# Patient Record
Sex: Female | Born: 1935 | Race: Black or African American | Hispanic: No | Smoking: Former smoker
Health system: Southern US, Community
[De-identification: ages and names within clinical notes are randomized; demographics above are authoritative.]

## PROBLEM LIST (undated history)

## (undated) DIAGNOSIS — M542 Cervicalgia: Secondary | ICD-10-CM

## (undated) DIAGNOSIS — R0602 Shortness of breath: Secondary | ICD-10-CM

## (undated) DIAGNOSIS — I10 Essential (primary) hypertension: Secondary | ICD-10-CM

## (undated) DIAGNOSIS — G8929 Other chronic pain: Secondary | ICD-10-CM

## (undated) DIAGNOSIS — I255 Ischemic cardiomyopathy: Secondary | ICD-10-CM

## (undated) DIAGNOSIS — I2510 Atherosclerotic heart disease of native coronary artery without angina pectoris: Secondary | ICD-10-CM

## (undated) DIAGNOSIS — M47812 Spondylosis without myelopathy or radiculopathy, cervical region: Secondary | ICD-10-CM

## (undated) DIAGNOSIS — E785 Hyperlipidemia, unspecified: Secondary | ICD-10-CM

## (undated) DIAGNOSIS — Z789 Other specified health status: Secondary | ICD-10-CM

## (undated) DIAGNOSIS — G459 Transient cerebral ischemic attack, unspecified: Secondary | ICD-10-CM

## (undated) DIAGNOSIS — I209 Angina pectoris, unspecified: Secondary | ICD-10-CM

## (undated) DIAGNOSIS — I219 Acute myocardial infarction, unspecified: Secondary | ICD-10-CM

## (undated) DIAGNOSIS — E039 Hypothyroidism, unspecified: Secondary | ICD-10-CM

## (undated) DIAGNOSIS — I495 Sick sinus syndrome: Secondary | ICD-10-CM

## (undated) DIAGNOSIS — J189 Pneumonia, unspecified organism: Secondary | ICD-10-CM

## (undated) DIAGNOSIS — I509 Heart failure, unspecified: Secondary | ICD-10-CM

## (undated) DIAGNOSIS — Z950 Presence of cardiac pacemaker: Secondary | ICD-10-CM

## (undated) HISTORY — PX: ABDOMINAL HYSTERECTOMY: SHX81

## (undated) HISTORY — DX: Hypothyroidism, unspecified: E03.9

## (undated) HISTORY — DX: Cervicalgia: M54.2

## (undated) HISTORY — DX: Hyperlipidemia, unspecified: E78.5

## (undated) HISTORY — PX: TONSILLECTOMY: SUR1361

## (undated) HISTORY — DX: Pneumonia, unspecified organism: J18.9

## (undated) HISTORY — DX: Spondylosis without myelopathy or radiculopathy, cervical region: M47.812

## (undated) HISTORY — DX: Hypercalcemia: E83.52

## (undated) HISTORY — PX: OTHER SURGICAL HISTORY: SHX169

## (undated) HISTORY — DX: Sick sinus syndrome: I49.5

## (undated) HISTORY — DX: Transient cerebral ischemic attack, unspecified: G45.9

## (undated) HISTORY — DX: Essential (primary) hypertension: I10

## (undated) HISTORY — DX: Other chronic pain: G89.29

---

## 1998-01-21 DIAGNOSIS — G459 Transient cerebral ischemic attack, unspecified: Secondary | ICD-10-CM

## 1998-01-21 HISTORY — DX: Transient cerebral ischemic attack, unspecified: G45.9

## 2000-01-22 DIAGNOSIS — M47812 Spondylosis without myelopathy or radiculopathy, cervical region: Secondary | ICD-10-CM

## 2000-01-22 DIAGNOSIS — G8929 Other chronic pain: Secondary | ICD-10-CM

## 2000-01-22 HISTORY — DX: Other chronic pain: G89.29

## 2000-01-22 HISTORY — DX: Spondylosis without myelopathy or radiculopathy, cervical region: M47.812

## 2001-01-21 DIAGNOSIS — I495 Sick sinus syndrome: Secondary | ICD-10-CM

## 2001-01-21 HISTORY — DX: Sick sinus syndrome: I49.5

## 2003-01-22 HISTORY — PX: INSERT / REPLACE / REMOVE PACEMAKER: SUR710

## 2003-12-16 ENCOUNTER — Ambulatory Visit: Payer: Self-pay | Admitting: Internal Medicine

## 2004-01-25 ENCOUNTER — Ambulatory Visit: Payer: Self-pay | Admitting: Family Medicine

## 2004-02-14 ENCOUNTER — Ambulatory Visit: Payer: Self-pay | Admitting: Family Medicine

## 2004-02-22 ENCOUNTER — Ambulatory Visit: Payer: Self-pay | Admitting: Family Medicine

## 2004-03-19 ENCOUNTER — Ambulatory Visit: Payer: Self-pay | Admitting: Internal Medicine

## 2004-04-16 ENCOUNTER — Ambulatory Visit: Payer: Self-pay | Admitting: Family Medicine

## 2004-05-09 ENCOUNTER — Ambulatory Visit: Payer: Self-pay | Admitting: Family Medicine

## 2004-06-15 ENCOUNTER — Ambulatory Visit: Payer: Self-pay | Admitting: Internal Medicine

## 2004-09-10 ENCOUNTER — Ambulatory Visit: Payer: Self-pay | Admitting: Internal Medicine

## 2004-09-14 ENCOUNTER — Ambulatory Visit: Payer: Self-pay | Admitting: Internal Medicine

## 2004-10-11 ENCOUNTER — Ambulatory Visit: Payer: Self-pay | Admitting: Internal Medicine

## 2004-11-06 ENCOUNTER — Ambulatory Visit: Payer: Self-pay | Admitting: Internal Medicine

## 2004-11-07 ENCOUNTER — Ambulatory Visit: Payer: Self-pay | Admitting: Internal Medicine

## 2004-11-13 ENCOUNTER — Ambulatory Visit: Payer: Self-pay | Admitting: Internal Medicine

## 2005-01-04 ENCOUNTER — Ambulatory Visit: Payer: Self-pay | Admitting: Internal Medicine

## 2005-02-12 ENCOUNTER — Ambulatory Visit: Payer: Self-pay | Admitting: Internal Medicine

## 2005-05-13 ENCOUNTER — Ambulatory Visit: Payer: Self-pay | Admitting: Internal Medicine

## 2005-06-24 ENCOUNTER — Ambulatory Visit: Payer: Self-pay | Admitting: Internal Medicine

## 2005-07-23 ENCOUNTER — Ambulatory Visit: Payer: Self-pay | Admitting: Internal Medicine

## 2005-09-11 ENCOUNTER — Ambulatory Visit: Payer: Self-pay | Admitting: Internal Medicine

## 2005-09-25 ENCOUNTER — Ambulatory Visit: Payer: Self-pay | Admitting: Internal Medicine

## 2005-10-29 ENCOUNTER — Ambulatory Visit: Payer: Self-pay | Admitting: Internal Medicine

## 2005-11-25 ENCOUNTER — Ambulatory Visit: Payer: Self-pay | Admitting: Internal Medicine

## 2005-11-25 LAB — CONVERTED CEMR LAB
ALT: 18 units/L (ref 0–40)
BUN: 21 mg/dL (ref 6–23)
Bilirubin, Direct: 0.1 mg/dL (ref 0.0–0.3)
Chol/HDL Ratio, serum: 1.8
Cholesterol: 218 mg/dL (ref 0–200)
Creatinine, Ser: 0.9 mg/dL (ref 0.4–1.2)
Total Bilirubin: 0.9 mg/dL (ref 0.3–1.2)
Triglyceride fasting, serum: 47 mg/dL (ref 0–149)

## 2005-11-29 ENCOUNTER — Ambulatory Visit: Payer: Self-pay | Admitting: Internal Medicine

## 2005-12-04 ENCOUNTER — Ambulatory Visit: Payer: Self-pay | Admitting: Internal Medicine

## 2006-02-26 ENCOUNTER — Ambulatory Visit: Payer: Self-pay | Admitting: Internal Medicine

## 2006-05-14 DIAGNOSIS — I255 Ischemic cardiomyopathy: Secondary | ICD-10-CM

## 2006-05-28 ENCOUNTER — Ambulatory Visit: Payer: Self-pay | Admitting: Internal Medicine

## 2006-07-28 ENCOUNTER — Ambulatory Visit: Payer: Self-pay | Admitting: Internal Medicine

## 2006-07-28 DIAGNOSIS — E785 Hyperlipidemia, unspecified: Secondary | ICD-10-CM

## 2006-07-28 DIAGNOSIS — I10 Essential (primary) hypertension: Secondary | ICD-10-CM | POA: Insufficient documentation

## 2006-07-28 LAB — CONVERTED CEMR LAB: OCCULT 2: NEGATIVE

## 2006-08-04 ENCOUNTER — Encounter (INDEPENDENT_AMBULATORY_CARE_PROVIDER_SITE_OTHER): Payer: Self-pay | Admitting: *Deleted

## 2006-11-10 ENCOUNTER — Ambulatory Visit: Payer: Self-pay | Admitting: Internal Medicine

## 2006-11-18 ENCOUNTER — Ambulatory Visit: Payer: Self-pay | Admitting: Internal Medicine

## 2006-11-25 ENCOUNTER — Encounter (INDEPENDENT_AMBULATORY_CARE_PROVIDER_SITE_OTHER): Payer: Self-pay | Admitting: *Deleted

## 2006-11-25 ENCOUNTER — Ambulatory Visit: Payer: Self-pay | Admitting: Internal Medicine

## 2006-11-28 ENCOUNTER — Encounter: Payer: Self-pay | Admitting: Internal Medicine

## 2006-12-29 ENCOUNTER — Ambulatory Visit: Payer: Self-pay | Admitting: Internal Medicine

## 2006-12-29 ENCOUNTER — Encounter: Payer: Self-pay | Admitting: Internal Medicine

## 2007-03-02 ENCOUNTER — Ambulatory Visit: Payer: Self-pay | Admitting: Internal Medicine

## 2007-03-09 ENCOUNTER — Ambulatory Visit: Payer: Self-pay | Admitting: Internal Medicine

## 2007-03-19 ENCOUNTER — Telehealth (INDEPENDENT_AMBULATORY_CARE_PROVIDER_SITE_OTHER): Payer: Self-pay | Admitting: *Deleted

## 2007-03-19 LAB — CONVERTED CEMR LAB
ALT: 14 units/L (ref 0–35)
CO2: 29 meq/L (ref 19–32)
Cholesterol: 246 mg/dL (ref 0–200)
GFR calc Af Amer: 77 mL/min
GFR calc non Af Amer: 64 mL/min
Glucose, Bld: 109 mg/dL — ABNORMAL HIGH (ref 70–99)
HDL: 131.3 mg/dL (ref >39.0)
Potassium: 3.8 meq/L (ref 3.5–5.1)
Sodium: 138 meq/L (ref 135–145)
Total CHOL/HDL Ratio: 1.9

## 2007-04-20 ENCOUNTER — Ambulatory Visit: Payer: Self-pay | Admitting: Internal Medicine

## 2007-06-29 ENCOUNTER — Ambulatory Visit: Payer: Self-pay

## 2007-07-16 ENCOUNTER — Ambulatory Visit: Payer: Self-pay | Admitting: Internal Medicine

## 2007-07-23 ENCOUNTER — Telehealth (INDEPENDENT_AMBULATORY_CARE_PROVIDER_SITE_OTHER): Payer: Self-pay | Admitting: *Deleted

## 2007-09-08 ENCOUNTER — Telehealth (INDEPENDENT_AMBULATORY_CARE_PROVIDER_SITE_OTHER): Payer: Self-pay | Admitting: *Deleted

## 2007-09-08 ENCOUNTER — Ambulatory Visit: Payer: Self-pay | Admitting: Internal Medicine

## 2007-10-12 ENCOUNTER — Telehealth (INDEPENDENT_AMBULATORY_CARE_PROVIDER_SITE_OTHER): Payer: Self-pay | Admitting: *Deleted

## 2007-10-13 ENCOUNTER — Ambulatory Visit: Payer: Self-pay

## 2007-10-14 ENCOUNTER — Ambulatory Visit: Payer: Self-pay | Admitting: Internal Medicine

## 2007-11-30 ENCOUNTER — Ambulatory Visit: Payer: Self-pay | Admitting: Internal Medicine

## 2008-02-03 ENCOUNTER — Ambulatory Visit: Payer: Self-pay | Admitting: Internal Medicine

## 2008-02-03 ENCOUNTER — Encounter (INDEPENDENT_AMBULATORY_CARE_PROVIDER_SITE_OTHER): Payer: Self-pay | Admitting: *Deleted

## 2008-02-10 ENCOUNTER — Telehealth (INDEPENDENT_AMBULATORY_CARE_PROVIDER_SITE_OTHER): Payer: Self-pay | Admitting: *Deleted

## 2008-02-19 ENCOUNTER — Ambulatory Visit: Payer: Self-pay | Admitting: Internal Medicine

## 2008-02-22 ENCOUNTER — Telehealth (INDEPENDENT_AMBULATORY_CARE_PROVIDER_SITE_OTHER): Payer: Self-pay | Admitting: *Deleted

## 2008-02-22 LAB — CONVERTED CEMR LAB
ALT: 17 units/L (ref 0–35)
AST: 23 units/L (ref 0–37)
Basophils Absolute: 0 10*3/uL (ref 0.0–0.1)
Calcium: 10.5 mg/dL (ref 8.4–10.5)
Creatinine, Ser: 0.8 mg/dL (ref 0.4–1.2)
Direct LDL: 88.2 mg/dL
Eosinophils Relative: 0.7 % (ref 0.0–5.0)
GFR calc non Af Amer: 73 mL/min
Glucose, Bld: 101 mg/dL — ABNORMAL HIGH (ref 70–99)
HCT: 36.6 % (ref 36.0–46.0)
MCHC: 34.3 g/dL (ref 30.0–36.0)
MCV: 91 fL (ref 78.0–100.0)
Monocytes Relative: 7.2 % (ref 3.0–12.0)
Neutrophils Relative %: 67.1 % (ref 43.0–77.0)
Potassium: 5.1 meq/L (ref 3.5–5.1)
RBC: 4.02 M/uL (ref 3.87–5.11)
RDW: 14 % (ref 11.5–14.6)
TSH: 0.49 microintl units/mL (ref 0.35–5.50)
Total CHOL/HDL Ratio: 1.8
Triglycerides: 41 mg/dL (ref 0–149)
WBC: 7.6 10*3/uL (ref 4.5–10.5)

## 2008-02-25 ENCOUNTER — Telehealth (INDEPENDENT_AMBULATORY_CARE_PROVIDER_SITE_OTHER): Payer: Self-pay | Admitting: *Deleted

## 2008-02-26 ENCOUNTER — Ambulatory Visit: Payer: Self-pay | Admitting: Internal Medicine

## 2008-03-07 ENCOUNTER — Encounter (INDEPENDENT_AMBULATORY_CARE_PROVIDER_SITE_OTHER): Payer: Self-pay | Admitting: *Deleted

## 2008-03-07 LAB — CONVERTED CEMR LAB: Fecal Occult Bld: NEGATIVE

## 2008-03-14 ENCOUNTER — Ambulatory Visit: Payer: Self-pay | Admitting: Internal Medicine

## 2008-04-29 ENCOUNTER — Encounter: Payer: Self-pay | Admitting: Internal Medicine

## 2008-07-02 DIAGNOSIS — I495 Sick sinus syndrome: Secondary | ICD-10-CM | POA: Insufficient documentation

## 2008-07-12 ENCOUNTER — Ambulatory Visit: Payer: Self-pay | Admitting: Internal Medicine

## 2008-07-19 ENCOUNTER — Ambulatory Visit: Payer: Self-pay | Admitting: Internal Medicine

## 2008-07-20 ENCOUNTER — Telehealth (INDEPENDENT_AMBULATORY_CARE_PROVIDER_SITE_OTHER): Payer: Self-pay | Admitting: *Deleted

## 2008-07-22 LAB — CONVERTED CEMR LAB
Creatinine, Ser: 1.1 mg/dL (ref 0.4–1.2)
GFR calc non Af Amer: 60.72 mL/min (ref >60)

## 2008-07-26 ENCOUNTER — Telehealth (INDEPENDENT_AMBULATORY_CARE_PROVIDER_SITE_OTHER): Payer: Self-pay | Admitting: *Deleted

## 2008-08-18 ENCOUNTER — Telehealth: Payer: Self-pay | Admitting: Internal Medicine

## 2008-08-19 ENCOUNTER — Ambulatory Visit: Payer: Self-pay | Admitting: Internal Medicine

## 2008-08-25 ENCOUNTER — Ambulatory Visit: Payer: Self-pay | Admitting: Internal Medicine

## 2008-08-25 DIAGNOSIS — L989 Disorder of the skin and subcutaneous tissue, unspecified: Secondary | ICD-10-CM | POA: Insufficient documentation

## 2008-09-06 ENCOUNTER — Encounter: Payer: Self-pay | Admitting: Internal Medicine

## 2008-09-19 ENCOUNTER — Telehealth (INDEPENDENT_AMBULATORY_CARE_PROVIDER_SITE_OTHER): Payer: Self-pay | Admitting: *Deleted

## 2008-09-20 ENCOUNTER — Encounter: Payer: Self-pay | Admitting: Internal Medicine

## 2008-10-04 ENCOUNTER — Ambulatory Visit: Payer: Self-pay | Admitting: Internal Medicine

## 2008-10-04 DIAGNOSIS — R5383 Other fatigue: Secondary | ICD-10-CM

## 2008-10-04 DIAGNOSIS — R5381 Other malaise: Secondary | ICD-10-CM

## 2008-10-05 ENCOUNTER — Ambulatory Visit: Payer: Self-pay | Admitting: Internal Medicine

## 2008-10-05 LAB — CONVERTED CEMR LAB: Anti Nuclear Antibody(ANA): NEGATIVE

## 2008-10-11 ENCOUNTER — Telehealth (INDEPENDENT_AMBULATORY_CARE_PROVIDER_SITE_OTHER): Payer: Self-pay | Admitting: *Deleted

## 2008-10-11 LAB — CONVERTED CEMR LAB
BUN: 8 mg/dL (ref 6–23)
Basophils Absolute: 0 10*3/uL (ref 0.0–0.1)
CO2: 26 meq/L (ref 19–32)
Calcium: 9.9 mg/dL (ref 8.4–10.5)
Eosinophils Absolute: 0 10*3/uL (ref 0.0–0.7)
GFR calc non Af Amer: 122.15 mL/min (ref >60)
HCT: 33.3 % — ABNORMAL LOW (ref 36.0–46.0)
Hemoglobin: 11.2 g/dL — ABNORMAL LOW (ref 12.0–15.0)
Lymphocytes Relative: 16.1 % (ref 12.0–46.0)
Monocytes Absolute: 0.8 10*3/uL (ref 0.1–1.0)
Monocytes Relative: 9.3 % (ref 3.0–12.0)
Potassium: 3.8 meq/L (ref 3.5–5.1)
RBC: 3.75 M/uL — ABNORMAL LOW (ref 3.87–5.11)
Rhuematoid fact SerPl-aCnc: <20 intl units/mL (ref 0.0–20.0)
Sed Rate: 111 mm/hr — ABNORMAL HIGH (ref 0–22)
Total CK: 26 units/L (ref 7–177)
WBC: 8.6 10*3/uL (ref 4.5–10.5)

## 2008-10-13 ENCOUNTER — Encounter: Payer: Self-pay | Admitting: Internal Medicine

## 2008-10-14 ENCOUNTER — Encounter: Payer: Self-pay | Admitting: Internal Medicine

## 2008-10-17 ENCOUNTER — Ambulatory Visit: Payer: Self-pay | Admitting: Internal Medicine

## 2008-10-21 LAB — CONVERTED CEMR LAB: Iron: 13 ug/dL — ABNORMAL LOW (ref 42–145)

## 2008-10-22 ENCOUNTER — Encounter: Payer: Self-pay | Admitting: Internal Medicine

## 2008-10-24 ENCOUNTER — Ambulatory Visit: Payer: Self-pay | Admitting: Internal Medicine

## 2008-10-25 ENCOUNTER — Encounter (INDEPENDENT_AMBULATORY_CARE_PROVIDER_SITE_OTHER): Payer: Self-pay | Admitting: *Deleted

## 2008-10-25 ENCOUNTER — Ambulatory Visit: Payer: Self-pay | Admitting: Internal Medicine

## 2008-11-10 ENCOUNTER — Encounter: Payer: Self-pay | Admitting: Internal Medicine

## 2008-11-22 ENCOUNTER — Ambulatory Visit: Payer: Self-pay | Admitting: Internal Medicine

## 2008-11-22 ENCOUNTER — Ambulatory Visit: Payer: Self-pay | Admitting: Gastroenterology

## 2008-11-22 LAB — CONVERTED CEMR LAB
Eosinophils Absolute: 0 10*3/uL (ref 0.0–0.7)
Eosinophils Relative: 0.4 % (ref 0.0–5.0)
HCT: 33.5 % — ABNORMAL LOW (ref 36.0–46.0)
Hemoglobin: 11.1 g/dL — ABNORMAL LOW (ref 12.0–15.0)
Lymphs Abs: 1.6 10*3/uL (ref 0.7–4.0)
MCV: 88.3 fL (ref 78.0–100.0)
Monocytes Absolute: 0.8 10*3/uL (ref 0.1–1.0)
Platelets: 425 10*3/uL — ABNORMAL HIGH (ref 150.0–400.0)
RDW: 15.9 % — ABNORMAL HIGH (ref 11.5–14.6)

## 2008-11-23 LAB — CONVERTED CEMR LAB: Fecal Occult Bld: POSITIVE

## 2008-11-30 ENCOUNTER — Encounter: Payer: Self-pay | Admitting: Gastroenterology

## 2008-11-30 ENCOUNTER — Ambulatory Visit: Payer: Self-pay | Admitting: Gastroenterology

## 2008-12-02 ENCOUNTER — Encounter: Payer: Self-pay | Admitting: Gastroenterology

## 2008-12-05 ENCOUNTER — Encounter: Payer: Self-pay | Admitting: Gastroenterology

## 2008-12-05 ENCOUNTER — Ambulatory Visit: Payer: Self-pay | Admitting: Gastroenterology

## 2008-12-07 ENCOUNTER — Telehealth (INDEPENDENT_AMBULATORY_CARE_PROVIDER_SITE_OTHER): Payer: Self-pay | Admitting: *Deleted

## 2008-12-09 ENCOUNTER — Encounter: Payer: Self-pay | Admitting: Gastroenterology

## 2008-12-16 ENCOUNTER — Telehealth: Payer: Self-pay | Admitting: Gastroenterology

## 2008-12-19 ENCOUNTER — Encounter: Payer: Self-pay | Admitting: Gastroenterology

## 2008-12-21 ENCOUNTER — Ambulatory Visit: Payer: Self-pay | Admitting: Cardiology

## 2008-12-21 ENCOUNTER — Telehealth: Payer: Self-pay | Admitting: Internal Medicine

## 2008-12-29 ENCOUNTER — Ambulatory Visit: Payer: Self-pay | Admitting: Internal Medicine

## 2009-01-03 LAB — CONVERTED CEMR LAB
ALT: 8 units/L (ref 0–35)
Basophils Absolute: 0 10*3/uL (ref 0.0–0.1)
Eosinophils Relative: 0.3 % (ref 0.0–5.0)
Folate: 3.3 ng/mL
HCT: 35.9 % — ABNORMAL LOW (ref 36.0–46.0)
Hemoglobin: 11.8 g/dL — ABNORMAL LOW (ref 12.0–15.0)
Lymphocytes Relative: 19.7 % (ref 12.0–46.0)
Lymphs Abs: 1.9 10*3/uL (ref 0.7–4.0)
MCHC: 32.9 g/dL (ref 30.0–36.0)
Monocytes Relative: 6.9 % (ref 3.0–12.0)
Neutro Abs: 7 10*3/uL (ref 1.4–7.7)
RBC: 4.04 M/uL (ref 3.87–5.11)
TSH: 0.3 microintl units/mL — ABNORMAL LOW (ref 0.35–5.50)
WBC: 9.6 10*3/uL (ref 4.5–10.5)

## 2009-01-06 LAB — CONVERTED CEMR LAB
Free T4: 1 ng/dL (ref 0.6–1.6)
T3, Free: 2.9 pg/mL (ref 2.3–4.2)

## 2009-01-23 ENCOUNTER — Ambulatory Visit: Payer: Self-pay | Admitting: Endocrinology

## 2009-01-23 DIAGNOSIS — E039 Hypothyroidism, unspecified: Secondary | ICD-10-CM | POA: Insufficient documentation

## 2009-01-24 ENCOUNTER — Encounter: Payer: Self-pay | Admitting: Endocrinology

## 2009-01-25 ENCOUNTER — Ambulatory Visit: Payer: Self-pay | Admitting: Internal Medicine

## 2009-02-02 ENCOUNTER — Encounter: Payer: Self-pay | Admitting: Internal Medicine

## 2009-02-06 ENCOUNTER — Encounter: Admission: RE | Admit: 2009-02-06 | Discharge: 2009-02-06 | Payer: Self-pay | Admitting: Endocrinology

## 2009-03-08 ENCOUNTER — Ambulatory Visit: Payer: Self-pay | Admitting: Endocrinology

## 2009-03-08 DIAGNOSIS — E042 Nontoxic multinodular goiter: Secondary | ICD-10-CM

## 2009-03-29 ENCOUNTER — Encounter (INDEPENDENT_AMBULATORY_CARE_PROVIDER_SITE_OTHER): Payer: Self-pay | Admitting: *Deleted

## 2009-04-14 ENCOUNTER — Ambulatory Visit: Payer: Self-pay | Admitting: Internal Medicine

## 2009-04-16 LAB — CONVERTED CEMR LAB
BUN: 10 mg/dL (ref 6–23)
Chloride: 101 meq/L (ref 96–112)
Potassium: 3.6 meq/L (ref 3.5–5.1)

## 2009-04-26 ENCOUNTER — Ambulatory Visit: Payer: Self-pay | Admitting: Internal Medicine

## 2009-05-10 ENCOUNTER — Encounter: Payer: Self-pay | Admitting: Internal Medicine

## 2009-06-12 ENCOUNTER — Ambulatory Visit: Payer: Self-pay | Admitting: Internal Medicine

## 2009-06-14 ENCOUNTER — Telehealth (INDEPENDENT_AMBULATORY_CARE_PROVIDER_SITE_OTHER): Payer: Self-pay | Admitting: *Deleted

## 2009-06-14 ENCOUNTER — Ambulatory Visit (HOSPITAL_COMMUNITY): Admission: RE | Admit: 2009-06-14 | Discharge: 2009-06-14 | Payer: Self-pay | Admitting: Internal Medicine

## 2009-06-14 ENCOUNTER — Telehealth: Payer: Self-pay | Admitting: Internal Medicine

## 2009-06-14 ENCOUNTER — Encounter: Payer: Self-pay | Admitting: Internal Medicine

## 2009-06-14 ENCOUNTER — Ambulatory Visit: Payer: Self-pay | Admitting: Internal Medicine

## 2009-06-14 ENCOUNTER — Ambulatory Visit: Payer: Self-pay

## 2009-06-14 LAB — CONVERTED CEMR LAB
Basophils Relative: 0.3 % (ref 0.0–3.0)
Eosinophils Absolute: 0 10*3/uL (ref 0.0–0.7)
Eosinophils Relative: 0.5 % (ref 0.0–5.0)
Folate: 8.5 ng/mL
Lymphocytes Relative: 35.6 % (ref 12.0–46.0)
Monocytes Relative: 7 % (ref 3.0–12.0)
Neutrophils Relative %: 56.6 % (ref 43.0–77.0)
RBC: 4.09 M/uL (ref 3.87–5.11)
Saturation Ratios: 20 % (ref 20.0–50.0)
Vitamin B-12: 237 pg/mL (ref 211–911)
WBC: 5.4 10*3/uL (ref 4.5–10.5)

## 2009-06-15 ENCOUNTER — Ambulatory Visit: Payer: Self-pay | Admitting: Cardiology

## 2009-06-16 ENCOUNTER — Encounter: Payer: Self-pay | Admitting: Internal Medicine

## 2009-06-20 ENCOUNTER — Ambulatory Visit: Payer: Self-pay

## 2009-06-20 ENCOUNTER — Encounter: Payer: Self-pay | Admitting: Internal Medicine

## 2009-06-21 DIAGNOSIS — E039 Hypothyroidism, unspecified: Secondary | ICD-10-CM

## 2009-06-21 HISTORY — DX: Hypothyroidism, unspecified: E03.9

## 2009-06-26 ENCOUNTER — Encounter (HOSPITAL_COMMUNITY)
Admission: RE | Admit: 2009-06-26 | Discharge: 2009-09-24 | Payer: Self-pay | Source: Home / Self Care | Admitting: Endocrinology

## 2009-07-11 ENCOUNTER — Ambulatory Visit: Payer: Self-pay | Admitting: Internal Medicine

## 2009-07-12 ENCOUNTER — Ambulatory Visit (HOSPITAL_COMMUNITY): Admission: RE | Admit: 2009-07-12 | Discharge: 2009-07-12 | Payer: Self-pay | Admitting: Endocrinology

## 2009-08-15 ENCOUNTER — Ambulatory Visit: Payer: Self-pay | Admitting: Internal Medicine

## 2009-11-16 ENCOUNTER — Ambulatory Visit: Payer: Self-pay | Admitting: Internal Medicine

## 2009-11-21 ENCOUNTER — Ambulatory Visit: Payer: Self-pay | Admitting: Internal Medicine

## 2009-11-21 ENCOUNTER — Encounter: Payer: Self-pay | Admitting: Internal Medicine

## 2009-11-21 DIAGNOSIS — J4489 Other specified chronic obstructive pulmonary disease: Secondary | ICD-10-CM | POA: Insufficient documentation

## 2009-11-21 DIAGNOSIS — J449 Chronic obstructive pulmonary disease, unspecified: Secondary | ICD-10-CM | POA: Insufficient documentation

## 2009-11-21 LAB — CONVERTED CEMR LAB
BUN: 18 mg/dL (ref 6–23)
Chloride: 108 meq/L (ref 96–112)
Free T4: 0.32 ng/dL — ABNORMAL LOW (ref 0.60–1.60)
Glucose, Bld: 59 mg/dL — ABNORMAL LOW (ref 70–99)
Potassium: 5.9 meq/L — ABNORMAL HIGH (ref 3.5–5.1)

## 2009-11-22 ENCOUNTER — Telehealth: Payer: Self-pay | Admitting: Internal Medicine

## 2009-11-23 ENCOUNTER — Ambulatory Visit: Payer: Self-pay | Admitting: Internal Medicine

## 2009-11-24 LAB — CONVERTED CEMR LAB
GFR calc non Af Amer: 83.77 mL/min (ref >60)
Glucose, Bld: 73 mg/dL (ref 70–99)
Potassium: 4.3 meq/L (ref 3.5–5.1)
Sodium: 142 meq/L (ref 135–145)

## 2009-12-11 ENCOUNTER — Telehealth (INDEPENDENT_AMBULATORY_CARE_PROVIDER_SITE_OTHER): Payer: Self-pay | Admitting: *Deleted

## 2009-12-13 ENCOUNTER — Ambulatory Visit: Payer: Self-pay | Admitting: Internal Medicine

## 2009-12-13 ENCOUNTER — Encounter (INDEPENDENT_AMBULATORY_CARE_PROVIDER_SITE_OTHER): Payer: Self-pay | Admitting: *Deleted

## 2009-12-13 LAB — CONVERTED CEMR LAB
BUN: 13 mg/dL (ref 6–23)
Basophils Absolute: 0 10*3/uL (ref 0.0–0.1)
CO2: 27 meq/L (ref 19–32)
Calcium: 10.3 mg/dL (ref 8.4–10.5)
Creatinine, Ser: 1 mg/dL (ref 0.40–1.20)
Eosinophils Relative: 0 % (ref 0–5)
Glucose, Bld: 101 mg/dL — ABNORMAL HIGH (ref 70–99)
HCT: 40.3 % (ref 36.0–46.0)
Hemoglobin: 13.6 g/dL (ref 12.0–15.0)
Lymphocytes Relative: 23 % (ref 12–46)
Lymphs Abs: 1.9 10*3/uL (ref 0.7–4.0)
Monocytes Absolute: 0.5 10*3/uL (ref 0.1–1.0)
Monocytes Relative: 7 % (ref 3–12)
RDW: 15.6 % — ABNORMAL HIGH (ref 11.5–15.5)

## 2009-12-14 ENCOUNTER — Encounter: Payer: Self-pay | Admitting: Internal Medicine

## 2009-12-15 ENCOUNTER — Telehealth (INDEPENDENT_AMBULATORY_CARE_PROVIDER_SITE_OTHER): Payer: Self-pay | Admitting: *Deleted

## 2009-12-18 ENCOUNTER — Ambulatory Visit (HOSPITAL_COMMUNITY)
Admission: RE | Admit: 2009-12-18 | Discharge: 2009-12-18 | Payer: Self-pay | Source: Home / Self Care | Admitting: Internal Medicine

## 2009-12-18 ENCOUNTER — Ambulatory Visit: Payer: Self-pay | Admitting: Internal Medicine

## 2009-12-22 ENCOUNTER — Ambulatory Visit: Payer: Self-pay | Admitting: Internal Medicine

## 2009-12-26 ENCOUNTER — Encounter: Payer: Self-pay | Admitting: Internal Medicine

## 2009-12-27 ENCOUNTER — Encounter: Payer: Self-pay | Admitting: Internal Medicine

## 2010-01-04 ENCOUNTER — Ambulatory Visit: Payer: Self-pay

## 2010-01-18 ENCOUNTER — Telehealth: Payer: Self-pay | Admitting: Internal Medicine

## 2010-01-23 ENCOUNTER — Telehealth (INDEPENDENT_AMBULATORY_CARE_PROVIDER_SITE_OTHER): Payer: Self-pay | Admitting: *Deleted

## 2010-01-23 ENCOUNTER — Ambulatory Visit
Admission: RE | Admit: 2010-01-23 | Discharge: 2010-01-23 | Payer: Self-pay | Source: Home / Self Care | Attending: Internal Medicine | Admitting: Internal Medicine

## 2010-01-26 ENCOUNTER — Ambulatory Visit
Admission: RE | Admit: 2010-01-26 | Discharge: 2010-01-26 | Payer: Self-pay | Source: Home / Self Care | Attending: Internal Medicine | Admitting: Internal Medicine

## 2010-02-11 ENCOUNTER — Encounter: Payer: Self-pay | Admitting: Internal Medicine

## 2010-02-11 ENCOUNTER — Encounter: Payer: Self-pay | Admitting: Endocrinology

## 2010-02-18 LAB — CONVERTED CEMR LAB
AST: 29 units/L (ref 0–37)
BUN: 12 mg/dL (ref 6–23)
CO2: 27 meq/L (ref 19–32)
Calcium, Total (PTH): 10 mg/dL (ref 8.4–10.5)
Chloride: 105 meq/L (ref 96–112)
Creatinine, Ser: 0.6 mg/dL (ref 0.4–1.2)
Direct LDL: 113.2 mg/dL
Glucose, Bld: 89 mg/dL (ref 70–99)
PTH: 73.8 pg/mL — ABNORMAL HIGH (ref 14.0–72.0)
Triglycerides: 50 mg/dL (ref 0–149)
VLDL: 10 mg/dL (ref 0–40)

## 2010-02-20 NOTE — Letter (Signed)
Summary: [place] No Show Letter  [facility] [place]  [address] [place], [place] [address]   Phone: [phone]  Fax: [phone]    03/29/2009 MRN: [id number]  [facility] [address] [place], [place]  [address]   Dear Ms. Bokhari,   Our records indicate that you missed your scheduled appointment with Dr. Drue Novel on 03/29/09.  Please contact this office to reschedule your appointment as soon as possible.  It is important that you keep your scheduled appointments with your physician, so we can provide you the best care possible.  Please be advised that there may be a charge for "no show" appointments.    Sincerely,   [facility]

## 2010-02-20 NOTE — Assessment & Plan Note (Signed)
Summary: 6 WK FU--D/T---STC   Vital Signs:  Patient profile:   77 year old female Height:      62 inches (157.48 cm) Weight:      104.13 pounds (47.33 kg) O2 Sat:      98 % on Room air Temp:     97.1 degrees F (36.17 degrees C) oral Pulse rate:   87 / minute BP sitting:   152 / 60  (left arm) Cuff size:   regular  Vitals Entered By: Josph Macho RMA (March 08, 2009 10:37 AM)  O2 Flow:  Room air CC: 6 week follow up/ CF Is Patient Diabetic? No   Primary Provider:  Willow Ora, MD  CC:  6 week follow up/ CF.  History of Present Illness: pt says she does not notice the goiter.  pt states she feels well in general, except for fatigue.  Current Medications (verified): 1)  Hydrochlorothiazide 25 Mg Tabs (Hydrochlorothiazide) .... Take 1 Tablet By Mouth 2)  Bayer Aspirin 325 Mg  Tabs (Aspirin) 3)  Benazepril Hcl 40 Mg  Tabs (Benazepril Hcl) .Marland Kitchen.. 1 By Mouth Once Daily 4)  Amlodipine Besylate 10 Mg  Tabs (Amlodipine Besylate) .Marland Kitchen.. 1 By Mouth Once Daily 5)  Vitamin D Otc .Marland Kitchen.. 1000 To 1200 Units A Day  Allergies (verified): No Known Drug Allergies  Past History:  Past Medical History: Last updated: 12/29/2008 Hyperlipidemia Hypertension tachy brady syndrome per holter 2003, s/p pacemaker implantation 2003 in [place] h/o HYPERCALCEMIA 2000 ?TIA had a  Carotid u/s 1-15% and a brain MRI lacunar dz 2002: chronic neck pain MRI Cspine: DJD 2002: ECHO diast. disfx and (-) stres ECHO h/o diastolic dysfunction that later on improved       Review of Systems       denies difficuty swalliwing or breathing.  Physical Exam  General:  normal appearance.   Neck:  i am uncertain if there is any thyroid enlargement.  i do not appreciate a nodule Additional Exam:  THYROID ULTRASOUND Features most likely secondary to a multinodular goiter. 1.5 cm mixed cystic and solid nodule in the lower pole of the left thyroid lobe.     Impression & Recommendations:  Problem # 1:  GOITER,  MULTINODULAR (ICD-241.1)  Problem # 2:  HYPERTHYROIDISM (ICD-242.90) mild, due to #1  Other Orders: Radiology Referral (Radiology) Est. Patient Level III (91478)  Patient Instructions: 1)  given your nodular thyroid disease ("lumpy thyroid"), you should expect the slow development of hyperthyroidism (overactive thyroid). 2)  please have a thyroid x ray, to see if the iodine treatment is feasable.  if it is feasable, i'll order the treatment, and please plan to return here approx 6 weeks later.

## 2010-02-20 NOTE — Letter (Signed)
Summary: Remote Device Check  [facility], Main Office  [address]   [place], [place] [address]   Phone: [phone]  Fax: [phone]     May 10, 2009 MRN: [id number]   [facility] [address] [place], [place]  [address]   Dear Lori Trujillo,   Your remote transmission was recieved and reviewed by your physician.  All diagnostics were within normal limits for you.    ___X___Your next office visit is scheduled for: JUNE 2011 WITH DR Graciela Husbands. Please call our office to schedule an appointment.    Sincerely,  Proofreader

## 2010-02-20 NOTE — Assessment & Plan Note (Signed)
Summary: FOLLOWUP AND FLU SHOT?///SPH   Vital Signs:  Patient profile:   77 year old female Weight:      114.50 pounds Pulse rate:   63 / minute Pulse rhythm:   regular BP sitting:   128 / 86  (left arm) Cuff size:   regular  Vitals Entered By: Army Fossa CMA (November 21, 2009 10:33 AM) CC: Pt here for f/u visit- not fasting  Comments [facility]   History of Present Illness: ROV doing well, no concerns  Review of systems Denies chest pain or shortness of breath, no palpitations Occasionally has  cough and hears wheezing. No sputum production. No lower extremity edema the last time she was here, she was having some dizziness. That has improved     Current Medications (verified): 1)  Hydrochlorothiazide 25 Mg Tabs (Hydrochlorothiazide) .... Take 1 Tablet By Mouth 2)  Bayer Aspirin 325 Mg  Tabs (Aspirin) 3)  Benazepril Hcl 40 Mg  Tabs (Benazepril Hcl) .Marland Kitchen.. 1 By Mouth Once Daily 4)  Amlodipine Besylate 10 Mg  Tabs (Amlodipine Besylate) .Marland Kitchen.. 1 By Mouth Once Daily 5)  Vitamin D3 5000 Unit Caps (Cholecalciferol) .... Take 1 Tab Every 3 Days  Allergies (verified): No Known Drug Allergies  Past History:  Past Medical History: Hyperlipidemia Hypertension hypothyroidism, status post treatment with iodine 06/2009 tachy brady syndrome per holter 2003, s/p pacemaker implantation 2003 in [place] h/o HYPERCALCEMIA 2000 ?TIA had a  Carotid u/s 1-15% and a brain MRI lacunar dz 2002: chronic neck pain MRI Cspine: DJD 2002: ECHO diast. disfx and (-) stres ECHO h/o diastolic dysfunction that later on improved       Past Surgical History: Reviewed history from 11/22/2008 and no changes required. Hysterectomy Tonsillectomy   Social History: lives by self,  independent on her ADL ,  sometimes has a difficult time getting in and out of the shower not driving anymore widow son died from a MI 06-23-2022 son and a daughter in [place] still does tai-chi  tobacco-- quit  ~2000, used  to smoke 1/4 ppd x years   Physical Exam  General:  alert, well-developed, and well-nourished.   Neck:  no JVD at 45 Lungs:  normal respiratory effort, no intercostal retractions, no accessory muscle use, and normal breath sounds (few dry crackles at bases ?) Heart:  normal rate, regular rhythm, and no murmur.   Abdomen:  soft and non-tender.   Extremities:  no lower extremity  edema   Impression & Recommendations:  Problem # 1:  COPD (ICD-496) the patient likely has COPD based on her smoking history, chronic changes on her chest x-ray last year, occasional wheezing. spirometry today  Orders: Spirometry w/Graph (94010)  Problem # 2:  GOITER, MULTINODULAR (ICD-241.1) had  i-131 treatment  6-11 labs Will ask endocrinology if she needs to be followup by them or  not  Orders: TLB-T4 (Thyrox), Free [phone]) TLB-T3, Free (Triiodothyronine) (84481-T3FREE) TLB-TSH (Thyroid Stimulating Hormone) (84443-TSH)  Problem # 3:  HYPERTENSION (ICD-401.9)  at goal  Her updated medication list for this problem includes:    Hydrochlorothiazide 25 Mg Tabs (Hydrochlorothiazide) .Marland Kitchen... Take 1 tablet by mouth    Benazepril Hcl 40 Mg Tabs (Benazepril hcl) .Marland Kitchen... 1 by mouth once daily    Amlodipine Besylate 10 Mg Tabs (Amlodipine besylate) .Marland Kitchen... 1 by mouth once daily  BP today: 128/86 Prior BP: 188/83 (08/15/2009)  Labs Reviewed: K+: 3.6 (04/14/2009) Creat: : 0.6 (04/14/2009)   Chol: 223 (02/19/2008)   HDL: 125.9 (02/19/2008)  LDL: DEL (02/19/2008)   TG: 41 (02/19/2008)  Orders: Venipuncture (16109) TLB-BMP (Basic Metabolic Panel-BMET) (80048-METABOL)  Problem # 4:  HYPERLIPIDEMIA (ICD-272.4)  Labs Reviewed: SGOT: 18 (12/29/2008)   SGPT: 8 (12/29/2008)   HDL:125.9 (02/19/2008), 131.3 (03/09/2007)  LDL:DEL (02/19/2008), DEL (03/09/2007)  Chol:223 (02/19/2008), 246 (03/09/2007)  Trig:41 (02/19/2008), 60 (03/09/2007)  Complete Medication List: 1)  Hydrochlorothiazide 25 Mg Tabs  (Hydrochlorothiazide) .... Take 1 tablet by mouth 2)  Bayer Aspirin 325 Mg Tabs (Aspirin) 3)  Benazepril Hcl 40 Mg Tabs (Benazepril hcl) .Marland Kitchen.. 1 by mouth once daily 4)  Amlodipine Besylate 10 Mg Tabs (Amlodipine besylate) .Marland Kitchen.. 1 by mouth once daily 5)  Vitamin D3 5000 Unit Caps (Cholecalciferol) .... Take 1 tab every 3 days  Patient Instructions: 1)  Please schedule a follow-up appointment in 3 months , fasting, physical exam Prescriptions: AMLODIPINE BESYLATE 10 MG  TABS (AMLODIPINE BESYLATE) 1 by mouth once daily  #90 x 3   Entered by:   Army Fossa CMA   Authorized by:   Nolon Rod. Elynn Patteson MD   Signed by:   Army Fossa CMA on 11/21/2009   Method used:   Electronically to        [facility] (retail)       [address]       [place], [place]  [address]       Ph: [phone]       Fax: [phone]   RxID:   [id number]    Orders Added: 1)  Venipuncture [44010] 2)  TLB-BMP (Basic Metabolic Panel-BMET) [80048-METABOL] 3)  TLB-T4 (Thyrox), Free [27253-GU4Q] 4)  TLB-T3, Free (Triiodothyronine) [03474-Q5ZDGL] 5)  TLB-TSH (Thyroid Stimulating Hormone) [84443-TSH] 6)  Spirometry w/Graph [94010] 7)  Est. Patient Level III [87564]   Immunization History:  Influenza Immunization History:    Influenza:  historical (10/27/2009)   Immunization History:  Influenza Immunization History:    Influenza:  Historical (10/27/2009)

## 2010-02-20 NOTE — Assessment & Plan Note (Signed)
Summary: NEW ENDO CON/ THYROID STUDY/ MEDICARE AND [employer]ARP / #   Vital Signs:  Patient profile:   77 year old female Height:      62 inches (157.48 cm) Weight:      101.38 pounds (46.08 kg) O2 Sat:      96 % on Room air Temp:     97.0 degrees F (36.11 degrees C) oral Pulse rate:   87 / minute BP sitting:   148 / 68  (left arm) Cuff size:   large  Vitals Entered By: Josph Macho CMA (January 23, 2009 3:37 PM)  O2 Flow:  Room air CC: New Endo: Thyroid/ CF Is Patient Diabetic? No   Primary Provider:  Willow Ora, MD  CC:  New Endo: Thyroid/ CF.  History of Present Illness: pt was recently noted to have a suppressed tsh last month.  it was previously low-normal.  she is unaware of ever having had a goiter or other thyroid problem.   symptomatically, pt states few months of slightly unsteady gait, but no associated tremor of the hands.   she feels as though interpretation of sxs is limited by lingering effects of her pneumonia a few months ago.  Current Medications (verified): 1)  Hydrochlorothiazide 25 Mg Tabs (Hydrochlorothiazide) .... Take 1 Tablet By Mouth 2)  Bayer Aspirin 325 Mg  Tabs (Aspirin) 3)  Benazepril Hcl 40 Mg  Tabs (Benazepril Hcl) .Marland Kitchen.. 1 By Mouth Once Daily 4)  Amlodipine Besylate 10 Mg  Tabs (Amlodipine Besylate) .Marland Kitchen.. 1 By Mouth Once Daily 5)  Vitamin D Otc .Marland Kitchen.. 1000 To 1200 Units A Day  Allergies (verified): No Known Drug Allergies  Past History:  Past Medical History: Last updated: 12/29/2008 Hyperlipidemia Hypertension tachy brady syndrome per holter 2003, s/p pacemaker implantation 2003 in [place] h/o HYPERCALCEMIA 2000 ?TIA had a  Carotid u/s 1-15% and a brain MRI lacunar dz 2002: chronic neck pain MRI Cspine: DJD 2002: ECHO diast. disfx and (-) stres ECHO h/o diastolic dysfunction that later on improved       Family History: no colon cancer no goiter or other thyroid problems.  Social History: Reviewed history from 11/22/2008 and no changes  required. lives by self widow son died from a MI 2022/06/01 son and a daughter in [place] still does tai-chi   Review of Systems       denies blurry vision, headache, chest pain, sob, n/v, cramps, excessive diaphoresis, depression, hypoglycemia, bruising.  she has lost a few lbs recently.  she has blurry vision.  she attributes polyuria to has diuretic therapy.  she reports memory loss.  Physical Exam  General:  normal appearance.   Head:  head: no deformity eyes: no periorbital swelling, no proptosis external nose and ears are normal mouth: no lesion seen Neck:  i am uncertain if there is any thyroid enlargement.  i do not appreciate a nodule Chest Wall:  there is slight kyphosis Lungs:  Clear to auscultation bilaterally. Normal respiratory effort.  Heart:  Regular rate and rhythm without murmurs or gallops noted. Normal S1,S2.   Msk:  muscle bulk and strength are grossly normal.  no obvious joint swelling.  gait is normal and steady  Pulses:  no carotid bruit  Extremities:  no edema no deformity Neurologic:  cn 2-12 grossly intact.   readily moves all 4's.   sensation is intact to touch on all 4's Skin:  normal texture and temp.  no rash.  not diaphoretic  Cervical Nodes:  No significant adenopathy.  Psych:  Alert and cooperative; normal mood and affect; normal attention span and concentration.   Additional Exam:  FastTSH   0.30 uIU/mL     Impression & Recommendations:  Problem # 1:  HYPERTHYROIDISM (ICD-242.90) mild.  probably due to multinodular goiter.  Problem # 2:  PNEUMONIA (ICD-486) this limits interpretation of sxs, but it is unlikely that her sxs are thyroid-related.  Problem # 3:  tachy-brady syndrome in view of this, she'll need treatment if her tsh becomes much more abnormal.  Other Orders: Radiology Referral (Radiology) Consultation Level IV (16109)  Patient Instructions: 1)  we discussed the causes, risks, and treatment options of hyperthyroidism (overactive  thyroid).   2)  check thyroid ultrasound.  the most likely result is a "lumpy thyroid." 3)  return 6 weeks.  Preventive Care Screening  Mammogram:    Date:  10/21/2008    Results:  historical

## 2010-02-20 NOTE — Cardiovascular Report (Signed)
Summary: Office Visit Remote   Office Visit Remote   Imported By: Roderic Ovens 12/04/2009 15:11:29  _____________________________________________________________________  External Attachment:    Type:   Image     Comment:   External Document

## 2010-02-20 NOTE — Assessment & Plan Note (Signed)
Summary: rto 1 month/cbs   Vital Signs:  Patient profile:   77 year old female Height:      62 inches Weight:      103 pounds Temp:     98.5 degrees F oral Pulse rate:   66 / minute BP sitting:   130 / 76  (left arm)  Vitals Entered By: Jeremy Johann CMA (July 11, 2009 1:21 PM) CC: 1 month f/u  Comments --dizziness better -REVIEWED MED LIST, PATIENT AGREED DOSE AND INSTRUCTION CORRECT    History of Present Illness: here with her son followup from the last office visit, she was having dizziness, TIA? Had a carotid ultrasound, 0-49% bilaterally, followup in one year CT of the head no acute findings Echocardiogram, LVH otherwise negative  Also had a thyroid scan prescribed by Dr. Everardo All, the results prompted a prescription for an  i-131   Allergies (verified): No Known Drug Allergies  Past History:  Past Medical History: Reviewed history from 12/29/2008 and no changes required. Hyperlipidemia Hypertension tachy brady syndrome per holter 2003, s/p pacemaker implantation 2003 in [place] h/o HYPERCALCEMIA 2000 ?TIA had a  Carotid u/s 1-15% and a brain MRI lacunar dz 2002: chronic neck pain MRI Cspine: DJD 2002: ECHO diast. disfx and (-) stres ECHO h/o diastolic dysfunction that later on improved       Past Surgical History: Reviewed history from 11/22/2008 and no changes required. Hysterectomy Tonsillectomy   Social History: Reviewed history from 04/14/2009 and no changes required. lives by self,  independent on her ADL ,  sometimes has a difficult time getting in and out of the shower widow son died from a MI 06-23-2022 son and a daughter in [place] still does tai-chi   Review of Systems       still feeling "wobbly" after she stands up No further slurred speech  Physical Exam  General:  alert and well-developed.   Lungs:  normal respiratory effort, no intercostal retractions, no accessory muscle use, and normal breath sounds.   Heart:  normal rate, regular rhythm,  and no murmur.   Extremities:  no edema Neurologic:  alert & oriented X3 and cranial nerves II-XII intact.  Speech seems clear Motor is intact Gait at baseline    Impression & Recommendations:  Problem # 1:  ? of TIA (ICD-435.9) workup negative, see history of present illness; all tests  discussed with the patient The "wobbly feeling" when she stands up  unlikely to represent a TIA. I recommend her to stand up slowly and use a cane Her updated medication list for this problem includes:    Bayer Aspirin 325 Mg Tabs (Aspirin)  Problem # 2:  GOITER, MULTINODULAR (ICD-241.1) to have a  i-131 treatment tomorrow Explained the patient the rationale to treat her Follow up with Dr. Everardo All  Problem # 3:  HYPERTENSION (ICD-401.9) at goal  Her updated medication list for this problem includes:    Hydrochlorothiazide 25 Mg Tabs (Hydrochlorothiazide) .Marland Kitchen... Take 1 tablet by mouth    Benazepril Hcl 40 Mg Tabs (Benazepril hcl) .Marland Kitchen... 1 by mouth once daily    Amlodipine Besylate 10 Mg Tabs (Amlodipine besylate) .Marland Kitchen... 1 by mouth once daily  BP today: 130/76 Prior BP: 140/80 (06/12/2009)  Labs Reviewed: K+: 3.6 (04/14/2009) Creat: : 0.6 (04/14/2009)   Chol: 223 (02/19/2008)   HDL: 125.9 (02/19/2008)   LDL: DEL (02/19/2008)   TG: 41 (02/19/2008)  Problem # 4:  face-to-face more than 15  minutes, more than 50% of the time discussing  her results   Complete Medication List: 1)  Hydrochlorothiazide 25 Mg Tabs (Hydrochlorothiazide) .... Take 1 tablet by mouth 2)  Bayer Aspirin 325 Mg Tabs (Aspirin) 3)  Benazepril Hcl 40 Mg Tabs (Benazepril hcl) .Marland Kitchen.. 1 by mouth once daily 4)  Amlodipine Besylate 10 Mg Tabs (Amlodipine besylate) .Marland Kitchen.. 1 by mouth once daily 5)  Vitamin D3 5000 Unit Caps (Cholecalciferol) .... Take 1 tab every 3 days  Patient Instructions: 1)  Please schedule a follow-up appointment in 3 months .

## 2010-02-20 NOTE — Miscellaneous (Signed)
Summary: Orders Update pft charges  Clinical Lists Changes  Orders: Added new Service order of Carbon Monoxide diffusing w/capacity (94720) - Signed Added new Service order of Lung Volumes (94240) - Signed Added new Service order of Spirometry (Pre & Post) (94060) - Signed 

## 2010-02-20 NOTE — Assessment & Plan Note (Signed)
Summary: appt 200p/pacer at eri/mt  Medications Added ASPIRIN EC 325 MG TBEC (ASPIRIN) Take one tablet by mouth daily      Allergies Added: NKDA  Visit Type:  Pacemaker check Primary Provider:  Willow Ora, MD  CC:  fatigue and edema/feet...denies any cp.  History of Present Illness:   Lori Trujillo is seen in [place] for sinus node dysfunction for which she is s.p pacer insertion, She also has a history of diasystolic CHF   She is doing well having undergone radiaton therapy for hyper thyroidism;  most recent labs in early November were consistent with ongoing hypothyroidism. She is complaining of fatigue and has attributed it to the above. She is however atrially paced and she is now reached ERI which is associated with reversion to a VVI mode. We don't have electrocardiogram to know whether she is ventricularly pacing or not.  Current Medications (verified): 1)  Hydrochlorothiazide 25 Mg Tabs (Hydrochlorothiazide) .... Take 1 Tablet By Mouth 2)  Aspirin Ec 325 Mg Tbec (Aspirin) .... Take One Tablet By Mouth Daily 3)  Benazepril Hcl 40 Mg  Tabs (Benazepril Hcl) .Marland Kitchen.. 1 By Mouth Once Daily 4)  Amlodipine Besylate 10 Mg  Tabs (Amlodipine Besylate) .Marland Kitchen.. 1 By Mouth Once Daily 5)  Vitamin D3 5000 Unit Caps (Cholecalciferol) .... Take 1 Tab Every 3 Days 6)  Synthroid 25 Mcg Tabs (Levothyroxine Sodium) .Marland Kitchen.. 1 By Mouth Daily. Recheck Tsh in 6 Weeks.  Allergies (verified): No Known Drug Allergies  Vital Signs:  Patient profile:   77 year old female Height:      62 inches Weight:      111 pounds BMI:     20.38 Pulse rate:   70 / minute Pulse rhythm:   regular BP sitting:   110 / 64  (left arm) Cuff size:   regular  Vitals Entered By: Danielle Rankin, CMA (December 13, 2009 2:17 PM)  Physical Exam  General:  The patient was alert and oriented in no acute distress. HEENT Normal.  Neck veins were8-9 cm carotids were brisk.  Lungs were clear. significant kyphosis Heart sounds were regular  without murmurs or gallops.  Abdomen was soft with active bowel sounds. There is no clubbing cyanosis or edema. Skin Warm and dry    PPM Specifications Following MD:  Sherryl Manges, MD     PPM Vendor:  [employer]     PPM Model Number:  [id number]     PPM Serial Number:  [id number] PPM DOI:  09/23/2001     PPM Implanting MD:  NOT IMPLANTED HERE  Lead 1    Location: RA     DOI: 09/23/2001     Model #: [id number]     Serial #: [id number]     Status: active Lead 2    Location: RV     DOI: 09/23/2001     Model #: [id number]     Serial #: [id number]     Status: active  PPM Follow Up Pacer Dependent:  No      Episodes Coumadin:  No  Parameters Mode:  DDDR     Lower Rate Limit:  60     Upper Rate Limit:  130 Paced AV Delay:  250     Sensed AV Delay:  220  Impression & Recommendations:  Problem # 1:  SINOATRIAL NODE DYSFUNCTION (ICD-427.81)  she has sinus node dysfunction and was atrially paced 86% of the time. With reversion she may well be ventricularly paced. This may contribute to her  fatigue.   Her updated medication list for this problem includes:    Aspirin Ec 325 Mg Tbec (Aspirin) .Marland Kitchen... Take one tablet by mouth daily    Benazepril Hcl 40 Mg Tabs (Benazepril hcl) .Marland Kitchen... 1 by mouth once daily    Amlodipine Besylate 10 Mg Tabs (Amlodipine besylate) .Marland Kitchen... 1 by mouth once daily  Orders: TLB-BMP (Basic Metabolic Panel-BMET) (80048-METABOL) TLB-CBC Platelet - w/Differential (85025-CBCD)  Problem # 2:  PACEMAKER, DDD  MDT (ICD-V45.01) She has reached ERI and is now VVI paced. We discussed the replacement of her pacemaker generator. We discussed potential benefits as well as potential risks primarily limited to infection. She and her son understand these risks and are willing to proceed. We'll schedule it at their convenience.  Problem # 3:  HYPERTENSION (ICD-401.9) her blood pressure is quite low today. This may be related to vasodilatation from ventricular pacing. If not it may allow down titration  of some of her anti-hypertensive. Her updated medication list for this problem includes:    Hydrochlorothiazide 25 Mg Tabs (Hydrochlorothiazide) .Marland Kitchen... Take 1 tablet by mouth    Aspirin Ec 325 Mg Tbec (Aspirin) .Marland Kitchen... Take one tablet by mouth daily    Benazepril Hcl 40 Mg Tabs (Benazepril hcl) .Marland Kitchen... 1 by mouth once daily    Amlodipine Besylate 10 Mg Tabs (Amlodipine besylate) .Marland Kitchen... 1 by mouth once daily  Patient Instructions: 1)  Your physician recommends that you have BMET and CBC w/DIff today.

## 2010-02-20 NOTE — Miscellaneous (Signed)
Summary: Orders Update  Clinical Lists Changes  Orders: Added new Test order of Carotid Duplex (Carotid Duplex) - Signed 

## 2010-02-20 NOTE — Progress Notes (Signed)
   Phone Note Outgoing Call   Call placed by: Scherrie Bateman, LPN,  December 15, 2009 1:58 PM Call placed to: Patient Summary of Call: SPOKE WITH PT RE MON PROCEDURE  PER DR KLEIN SEE IF PT CAN MOVE  TIME TO 7:30 IF  SO PT NEEDS TO BE THERE AT 6:00. PT  TO CALL BACK AFTER SPEAKS WITH FAMILY RE GETTING TRANSPOSTATION ARRRANGEMENTS MADE. Initial call taken by: Scherrie Bateman, LPN,  December 15, 2009 2:02 PM  Follow-up for Phone Call        Phone call completed PT CONFIRMED WILL BE AT SHORT STAY  ON MON AT 6:00 AM. Follow-up by: Scherrie Bateman, LPN,  December 15, 2009 4:59 PM

## 2010-02-20 NOTE — Assessment & Plan Note (Signed)
Summary: dizziness/alr   Vital Signs:  Patient profile:   77 year old female Height:      62 inches Weight:      102.6 pounds BMI:     18.83 O2 Sat:      91 % on Room air Pulse rate:   76 / minute BP sitting:   140 / 80  Vitals Entered By: Shary Decamp (Jun 12, 2009 3:24 PM)  O2 Flow:  Room air CC: several episodes of pt feeling "unsteady" when she stands up, states she is not dizzy, +fatigue, family has noticed that she does have some slurred speech   History of Present Illness: here with 2 family members In addition to the  mild dizzy feeling that she has whenever she stands up and lasts one minute, the family has noted also that she gets dizzy on and off:  These spells are random, the patient becomes weaker than usual and her speech is slurred. There is no associated focal deficits,  the patient denies diplopia.  ROS Sometimes feel fatigue, no energy. Denies chest pain or palpitations Sometimes has dyspnea on exertion. No LE edema  Current Medications (verified): 1)  Hydrochlorothiazide 25 Mg Tabs (Hydrochlorothiazide) .... Take 1 Tablet By Mouth 2)  Bayer Aspirin 325 Mg  Tabs (Aspirin) 3)  Benazepril Hcl 40 Mg  Tabs (Benazepril Hcl) .Marland Kitchen.. 1 By Mouth Once Daily 4)  Amlodipine Besylate 10 Mg  Tabs (Amlodipine Besylate) .Marland Kitchen.. 1 By Mouth Once Daily 5)  Vitamin D Otc .Marland Kitchen.. 1000 To 1200 Units A Day  Allergies (verified): No Known Drug Allergies  Review of Systems      See HPI  Physical Exam  General:  alert, well-developed, and well-nourished.   Neck:  no masses, normal carotid upstroke, and no carotid bruits.   Lungs:  normal respiratory effort, no intercostal retractions, no accessory muscle use, and normal breath sounds.   Heart:  normal rate, regular rhythm, and no murmur.   Extremities:  no lower extremity edema Neurologic:  alert & oriented X3 and cranial nerves II-XII intact.  Speech seems clear Motor is intact Gait at baseline    Impression &  Recommendations:  Problem # 1:  ? of TIA (ICD-435.9)  the patient presents with mild dizziness when she stands up but also dizziness randomly associated with a questionable slurred speech. This later symptoms may be TIA related Neurological exam is at baseline, carotid arteries are normal to palpation without a bruit She has a pacemaker and it is routinely checked by cardiology Plan: carotid ultrasound, echo,  brain MRI Depending on results, she may need Plavix Continue with aspirin  Her updated medication list for this problem includes:    Bayer Aspirin 325 Mg Tabs (Aspirin)  Orders: Cardiology Referral (Cardiology) Radiology Referral (Radiology)  Problem # 2:  GOITER, MULTINODULAR (ICD-241.1) Will contact Dr. Everardo All   Problem # 3:  ANEMIA, IRON DEFICIENCY (ICD-280.9) labs  Orders: Hgb (85018) Fingerstick (36416) Venipuncture (84132) TLB-IBC Pnl (Iron/FE;Transferrin) (83550-IBC) TLB-B12 + Folate Pnl (44010_27253-G64/QIH) TLB-CBC Platelet - w/Differential (85025-CBCD)  Complete Medication List: 1)  Hydrochlorothiazide 25 Mg Tabs (Hydrochlorothiazide) .... Take 1 tablet by mouth 2)  Bayer Aspirin 325 Mg Tabs (Aspirin) 3)  Benazepril Hcl 40 Mg Tabs (Benazepril hcl) .Marland Kitchen.. 1 by mouth once daily 4)  Amlodipine Besylate 10 Mg Tabs (Amlodipine besylate) .Marland Kitchen.. 1 by mouth once daily 5)  Vitamin D Otc  .Marland Kitchen.. 1000 to 1200 units a day  Patient Instructions: 1)  Please schedule a follow-up appointment  in 1 month.  Prescriptions: HYDROCHLOROTHIAZIDE 25 MG TABS (HYDROCHLOROTHIAZIDE) Take 1 tablet by mouth  #90 x 3   Entered by:   Shary Decamp   Authorized by:   Nolon Rod. Paz MD   Signed by:   Shary Decamp on 06/12/2009   Method used:   Electronically to        [facility] (retail)       [address]       [place], [place]  [address]       Ph: [phone]       Fax: [phone]   RxID:   [id number]   Laboratory Results   CBC   HGB:  11.7  g/dL   (Normal Range: 84.1-32.4 in Males, 12.0-15.0 in Females)

## 2010-02-20 NOTE — Cardiovascular Report (Signed)
Summary: Office Visit   Office Visit   Imported By: Roderic Ovens 08/17/2009 15:04:03  _____________________________________________________________________  External Attachment:    Type:   Image     Comment:   External Document

## 2010-02-20 NOTE — Letter (Signed)
Summary: Remote Device Check  [employer], Main Office  [address]   [place], [place] [address]   Phone: [phone]  Fax: [phone]     February 02, 2009 MRN: [id number]   [facility] [address] [place], [place]  [address]   Dear Ms. Penaflor,   Your remote transmission was recieved and reviewed by your physician.  All diagnostics were within normal limits for you.  __X___Your next transmission is scheduled for: April 26, 2009.  Please transmit at any time this day.  If you have a wireless device your transmission will be sent automatically.      Sincerely,  Proofreader

## 2010-02-20 NOTE — Letter (Signed)
Summary: Implantable Device Instructions  [facility] [employer], Main Office  [address]   [place], [place] [address]   Phone: [phone]  Fax: [phone]      Implantable Device Instructions  You are scheduled for:  _____ Permanent Transvenous Pacemaker _____ Implantable Cardioverter Defibrillator _____ Implantable Loop Recorder ___X _ Generator Change  on December 18, 2909 at 10:00 with Dr. Graciela Husbands.  1.  Please arrive at the Short Stay Center at [facility] at 8:00 am on the day of your procedure.  2.  Do not eat or drink after midnight the the night before your procedure.  3.  Complete lab work today.  The lab at [facility] is open from 8:30 AM to 1:30 PM and from 2:30 PM to 5:00 PM.  The lab at [facility] is open from 7:30 AM to 5:30 PM.  You do not have to be fasting.  4.  Do NOT take Hydrochlorothiazide the morning of your procedure.  Take all other medications with a sip of water.  5.  Plan for an overnight stay.  Bring your insurance cards and a list of your medications.  6.  Wash your chest and neck with antibacterial soap (any brand) the evening before and the morning of your procedure.  Rinse well.   *If you have ANY questions after you get home, please call the office [phone]. Claris Gladden  *Every attempt is made to prevent procedures from being rescheduled.  Due to the nauture of Electrophysiology, rescheduling can happen.  The physician is always aware and directs the staff when this occurs.

## 2010-02-20 NOTE — Progress Notes (Signed)
Summary: radiology referral  ---- Converted from flag ---- ---- 06/14/2009 1:55 PM, Jose E. Paz MD wrote: please change to a head CT without contrast  ---- 06/13/2009 4:27 PM, [facility] wrote: Patient is s/p Pacemaker, per Radiology unable to perform MRI, please advise. ------------------------------

## 2010-02-20 NOTE — Assessment & Plan Note (Signed)
Summary: 3 MONTH OV//PH   Vital Signs:  Patient profile:   77 year old female Height:      62 inches Weight:      107.2 pounds Pulse rate:   68 / minute BP sitting:   134 / 68  Vitals Entered By: R.Peeler CC: roa Comments wants to discuss thyroid results   History of Present Illness: routine office visit  Allergies: No Known Drug Allergies  Past History:  Past Medical History: Reviewed history from 12/29/2008 and no changes required. Hyperlipidemia Hypertension tachy brady syndrome per holter 2003, s/p pacemaker implantation 2003 in [place] h/o HYPERCALCEMIA 2000 ?TIA had a  Carotid u/s 1-15% and a brain MRI lacunar dz 2002: chronic neck pain MRI Cspine: DJD 2002: ECHO diast. disfx and (-) stres ECHO h/o diastolic dysfunction that later on improved       Past Surgical History: Reviewed history from 11/22/2008 and no changes required. Hysterectomy Tonsillectomy   Social History: lives by self,  independent on her ADL ,  sometimes has a difficult time getting in and out of the shower widow son died from a MI 11-Jun-2022 son and a daughter in [place] still does tai-chi   Review of Systems       in general doing well, feels mildly fatigue sometimes ooc.  he also feels slightly dizzy when she stands up, no falls or loss of consciousness sometimes has  trouble getting  in an out of the shower but so far has been doing that independently denies chest pain, shortness of breath or edema she takes iron pill every other day   Physical Exam  General:  alert and well-developed.   Lungs:  normal respiratory effort, no intercostal retractions, no accessory muscle use, and normal breath sounds.   Heart:  normal rate, regular rhythm, and no murmur.   Extremities:  no edema Psych:  Oriented X3, memory intact for recent and remote, normally interactive, good eye contact, not anxious appearing, and not depressed appearing.     Impression & Recommendations:  Problem # 1:  ADL    sometimes has a difficult time getting in and out of the shower offered a HH referal: qualify for  help? Declined, thinks is handling it okay so far  Problem # 2:  GOITER, MULTINODULAR (ICD-241.1) per endocrinology  Problem # 3:  ANEMIA, IRON DEFICIENCY (ICD-280.9) on iron every other day labs  Orders: TLB-Hemoglobin (Hgb) (85018-HGB)  Problem # 4:  HYPERTENSION (ICD-401.9) labs   Her updated medication list for this problem includes:    Hydrochlorothiazide 25 Mg Tabs (Hydrochlorothiazide) .Marland Kitchen... Take 1 tablet by mouth    Benazepril Hcl 40 Mg Tabs (Benazepril hcl) .Marland Kitchen... 1 by mouth once daily    Amlodipine Besylate 10 Mg Tabs (Amlodipine besylate) .Marland Kitchen... 1 by mouth once daily  Orders: Venipuncture ([address]) TLB-BMP (Basic Metabolic Panel-BMET) (80048-METABOL)  Problem # 5:  slightly dizzy when she stands up symptoms are mild, no recent falls  recommend to stand up slowly and holding onto something to prevent falls   Complete Medication List: 1)  Hydrochlorothiazide 25 Mg Tabs (Hydrochlorothiazide) .... Take 1 tablet by mouth 2)  Bayer Aspirin 325 Mg Tabs (Aspirin) 3)  Benazepril Hcl 40 Mg Tabs (Benazepril hcl) .Marland Kitchen.. 1 by mouth once daily 4)  Amlodipine Besylate 10 Mg Tabs (Amlodipine besylate) .Marland Kitchen.. 1 by mouth once daily 5)  Vitamin D Otc  .Marland Kitchen.. 1000 to 1200 units a day  Patient Instructions: 1)  Please schedule a follow-up appointment in 4 months  (  fasting)

## 2010-02-20 NOTE — Miscellaneous (Signed)
Summary: Orders Update   Clinical Lists Changes  Orders: Added new Referral order of Radiology Referral (Radiology) - Signed 

## 2010-02-20 NOTE — Progress Notes (Signed)
Summary: Thyroid nuc med uptake and scan appt   Phone Note From Other Clinic   Caller: Referral Coordinator Reason for Call: Schedule Patient Appt Details for Reason: pt scheduled for  thyroid nuc med uptake and scan  Summary of Call: pt was scheduled for June 6, June 7 @12 :00 noon  for thyroid nuc med uptake  and scan for hyperthyroidism  @  , pt has been informed and made aware of this appt , spoke with pt  and signed order was faxed to [facility] [phone] Att toni . could not add this information in referal because someone put it in completetion  Initial call taken by: Shelbie Proctor,  Jun 14, 2009 9:11 AM

## 2010-02-20 NOTE — Cardiovascular Report (Signed)
Summary: Office Visit Remote   Office Visit Remote   Imported By: Roderic Ovens 02/02/2009 11:17:55  _____________________________________________________________________  External Attachment:    Type:   Image     Comment:   External Document

## 2010-02-20 NOTE — Cardiovascular Report (Signed)
Summary: Office Visit Remote  Office Visit Remote   Imported By: Roderic Ovens 05/10/2009 14:24:27  _____________________________________________________________________  External Attachment:    Type:   Image     Comment:   External Document

## 2010-02-20 NOTE — Letter (Signed)
Summary: CT Imaging Options Form  CT Imaging Options Form   Imported By: Lanelle Bal 06/23/2009 10:16:07  _____________________________________________________________________  External Attachment:    Type:   Image     Comment:   External Document

## 2010-02-20 NOTE — Progress Notes (Signed)
Summary: PFTs   Phone Note Other Incoming   Summary of Call: Do you want the PFT's scheduled with [place] or with Pulmonary?  Initial call taken by: Army Fossa CMA,  December 11, 2009 8:33 AM  Follow-up for Phone Call        with pulmonary, no spirometry but full PFTs Follow-up by: [facility] E. Paz MD,  December 11, 2009 8:54 AM  Additional Follow-up for Phone Call Additional follow up Details #1::        Left message for pt, put in referral.  Additional Follow-up by: Army Fossa CMA,  December 11, 2009 9:02 AM    Additional Follow-up for Phone Call Additional follow up Details #2::    Per referral, patient is aware of appt with pulmonary.  Follow-up by: Harold Barban,  December 12, 2009 9:40 AM

## 2010-02-20 NOTE — Assessment & Plan Note (Signed)
Summary: pacer check.mdt.amber      Allergies Added: NKDA  Primary Provider:  Willow Ora, MD  CC:  device check.  History of Present Illness:   Lori Trujillo is seen in [place] for sinus node dysfunction for which she is s.p pacer insertion, She also has a history of diasystolic CHF   She is doing well just having undergone radiaton therapy for hyper thyroidism  Current Medications (verified): 1)  Hydrochlorothiazide 25 Mg Tabs (Hydrochlorothiazide) .... Take 1 Tablet By Mouth 2)  Bayer Aspirin 325 Mg  Tabs (Aspirin) 3)  Benazepril Hcl 40 Mg  Tabs (Benazepril Hcl) .Marland Kitchen.. 1 By Mouth Once Daily 4)  Amlodipine Besylate 10 Mg  Tabs (Amlodipine Besylate) .Marland Kitchen.. 1 By Mouth Once Daily 5)  Vitamin D3 5000 Unit Caps (Cholecalciferol) .... Take 1 Tab Every 3 Days  Allergies (verified): No Known Drug Allergies  Past History:  Past Medical History: Last updated: 12/29/2008 Hyperlipidemia Hypertension tachy brady syndrome per holter 2003, s/p pacemaker implantation 2003 in [place] h/o HYPERCALCEMIA 2000 ?TIA had a  Carotid u/s 1-15% and a brain MRI lacunar dz 2002: chronic neck pain MRI Cspine: DJD 2002: ECHO diast. disfx and (-) stres ECHO h/o diastolic dysfunction that later on improved       Past Surgical History: Last updated: 11/22/2008 Hysterectomy Tonsillectomy   Family History: Last updated: 01/23/2009 no colon cancer no goiter or other thyroid problems.  Social History: Last updated: 04/14/2009 lives by self,  independent on her ADL ,  sometimes has a difficult time getting in and out of the shower widow son died from a MI 06-04-22 son and a daughter in [place] still does tai-chi   Vital Signs:  Patient profile:   77 year old female Height:      62 inches Weight:      105 pounds BMI:     19.27 Pulse rate:   67 / minute Pulse rhythm:   regular BP sitting:   188 / 83  (left arm) Cuff size:   regular  Vitals Entered By: Judithe Modest CMA (August 15, 2009 10:00  AM)  Physical Exam  General:  The patient was alert and oriented in no acute distress. HEENT Normal.  Neck veins were flat, carotids were brisk.  Lungs were clear.  Heart sounds were regular without murmurs; split S1 Abdomen was soft with active bowel sounds. There is no clubbing cyanosis or edema. Skin Warm and dry    PPM Specifications Following MD:  Sherryl Manges, MD     PPM Vendor:  Medtronic     PPM Model Number:  [id number]     PPM Serial Number:  [id number] PPM DOI:  09/23/2001     PPM Implanting MD:  NOT IMPLANTED HERE  Lead 1    Location: RA     DOI: 09/23/2001     Model #: [id number]     Serial #: [id number]     Status: active Lead 2    Location: RV     DOI: 09/23/2001     Model #: [id number]     Serial #: [id number]     Status: active  PPM Follow Up Remote Check?  No Battery Voltage:  2.67 V     Battery Est. Longevity:  13 months     Pacer Dependent:  No       PPM Device Measurements Atrium  Amplitude: 2.8 mV, Impedance: 482 ohms, Threshold: 0.5 V at 0.4 msec Right Ventricle  Amplitude: 11.20 mV, Impedance: 669 ohms, Threshold:  0.75 V at 0.4 msec  Episodes MS Episodes:  0     Percent Mode Switch:  0     Coumadin:  No Ventricular High Rate:  0     Atrial Pacing:  88.2%     Ventricular Pacing:  0.6%  Parameters Mode:  DDDR     Lower Rate Limit:  60     Upper Rate Limit:  130 Paced AV Delay:  250     Sensed AV Delay:  220 Next Remote Date:  11/16/2009     Next Cardiology Appt Due:  07/22/2010 Tech Comments:  Ventricular autocapture on.  Carelink transmissions every 3 months.  Battery nearing ERI. ROV 1 year with Dr. Graciela Husbands. Altha Harm, LPN  August 15, 2009 10:06 AM   Impression & Recommendations:  Problem # 1:  PACEMAKER, DDD  MDT (ICD-V45.01) Device parameters and data were reviewed and no changes were made  Problem # 2:  SINOATRIAL NODE DYSFUNCTION (ICD-427.81) she is 100% a paced, and appears also by histograms to  have some pvcs Her updated medication list for this problem  includes:    Bayer Aspirin 325 Mg Tabs (Aspirin)    Benazepril Hcl 40 Mg Tabs (Benazepril hcl) .Marland Kitchen... 1 by mouth once daily    Amlodipine Besylate 10 Mg Tabs (Amlodipine besylate) .Marland Kitchen... 1 by mouth once daily  Problem # 3:  DIASTOLIC DYSFUNCTION (ICD-429.9) stable  Patient Instructions: 1)  Your physician wants you to follow-up in:12 months with Dr Graciela Husbands.   You will receive a reminder letter in the mail two months in advance. If you don't receive a letter, please call our office to schedule the follow-up appointment.

## 2010-02-20 NOTE — Miscellaneous (Signed)
Summary: Device change  Clinical Lists Changes  Observations: Added new observation of PPM IMP MD: Sherryl Manges, MD (12/27/2009 8:49) Added new observation of PPM DOI: 12/18/2009 (12/27/2009 8:49) Added new observation of PPM SERL#: [id number] (12/27/2009 8:49) Added new observation of PPM MODL#: [id number] (12/27/2009 8:49) Added new observation of PPMEXPLCOMM: 12/18/2009 Medtronic [id number] H explanted (12/27/2009 8:49) Added new observation of PPM INDICATN: Sick sinus syndrome (12/27/2009 8:49) Added new observation of MAGNET RTE: BOL 85 ERI 65 (12/27/2009 8:49)      PPM Specifications Following MD:  Sherryl Manges, MD     PPM Vendor:  [employer]onic     PPM Model Number:  [id number]     PPM Serial Number:  [id number] PPM DOI:  12/18/2009     PPM Implanting MD:  Sherryl Manges, MD  Lead 1    Location: RA     DOI: 09/23/2001     Model #: [id number]     Serial #: [id number]     Status: active Lead 2    Location: RV     DOI: 09/23/2001     Model #: [id number]     Serial #: [id number]     Status: active  Magnet Response Rate:  BOL 85 ERI 65  Indications:  Sick sinus syndrome  Explantation Comments:  12/18/2009 Medtronic [id number] H explanted  PPM Follow Up Pacer Dependent:  No      Episodes Coumadin:  No  Parameters Mode:  DDDR     Lower Rate Limit:  60     Upper Rate Limit:  130 Paced AV Delay:  250     Sensed AV Delay:  220

## 2010-02-20 NOTE — Progress Notes (Signed)
Summary: labs , star Synthroid  Phone Note Outgoing Call   Summary of Call: please call patient first thing in the morning. we need to check a stat BMP Discuss potassium results with  one of my colleagues (I'll be  out of town) also start Synthroid  25 micrograms daily, recheck a TSH in 6 weeks.Elita Quick E. Paz MD  November 22, 2009 5:49 PM      Follow-up for Phone Call        I spoke with pts daughter, she was going to try to get someone to bring her in today. Sent in Synthroid. Follow-up by: Army Fossa CMA,  November 23, 2009 8:53 AM  Additional Follow-up for Phone Call Additional follow up Details #1::        Pt here for labwork, on lab schedule for a STAT BMP!  Additional Follow-up by: Army Fossa CMA,  November 23, 2009 10:36 AM    New/Updated Medications: SYNTHROID 25 MCG TABS (LEVOTHYROXINE SODIUM) 1 by mouth daily. Recheck TSH in 6 weeks. Prescriptions: SYNTHROID 25 MCG TABS (LEVOTHYROXINE SODIUM) 1 by mouth daily. Recheck TSH in 6 weeks.  #30 x 2   Entered by:   Army Fossa CMA   Authorized by:   Nolon Rod. Paz MD   Signed by:   Army Fossa CMA on 11/23/2009   Method used:   Electronically to        [facility] (retail)       [address]       [place], [place]  [address]       Ph: [phone]       Fax: [phone]   RxID:   [id number]

## 2010-02-22 NOTE — Cardiovascular Report (Signed)
Summary: Pre Op Orders   Pre Op Orders   Imported By: Roderic Ovens 12/26/2009 15:08:52  _____________________________________________________________________  External Attachment:    Type:   Image     Comment:   External Document

## 2010-02-22 NOTE — Progress Notes (Signed)
Summary: Refill Request  Phone Note Refill Request Message from:  Patient on January 23, 2010 10:14 AM  Refills Requested: Medication #1:  SYNTHROID 25 MCG TABS 1 by mouth daily. Recheck TSH in 6 weeks..   Dosage confirmed as above?Dosage Confirmed   Supply Requested: 3 months   Last Refilled: 11/23/2009 [facility] [address]  Next Appointment Scheduled: lab today Initial call taken by: Harold Barban,  January 23, 2010 10:14 AM    Prescriptions: SYNTHROID 25 MCG TABS (LEVOTHYROXINE SODIUM) 1 by mouth daily. Recheck TSH in 6 weeks.  #90 x 0   Entered by:   Army Fossa CMA   Authorized by:   Nolon Rod. Paz MD   Signed by:   Army Fossa CMA on 01/23/2010   Method used:   Electronically to        [facility] (retail)       [address]       [place], [place]  [address]       Ph: [phone]       Fax: [phone]   RxID:   [id number]

## 2010-02-22 NOTE — Progress Notes (Signed)
Summary: due TSH  Phone Note Outgoing Call   Summary of Call: check on patient: was Rx synthroid, due for TSH. If no compliance , let me know Jose E. Paz MD  January 18, 2010 2:31 PM   Follow-up for Phone Call        I spoke w/ pt she take her Synthroid. Made lab appt. Army Fossa CMA  January 18, 2010 2:44 PM

## 2010-02-22 NOTE — Assessment & Plan Note (Signed)
Summary: Discuss TSH level/drb   Vital Signs:  Patient profile:   77 year old female Weight:      105.38 pounds Pulse rate:   76 / minute Pulse rhythm:   regular BP sitting:   118 / 80  (left arm) Cuff size:   regular  Vitals Entered By: Army Fossa CMA (January 26, 2010 2:12 PM) CC: Pt here to discuss lab results.  Comments [facility]   History of Present Illness: follow up for hypothyroidism management Status post thyroid ablation 6-11, TSH was elevated,   ~  11-22-09, patient was prescribed Synthroid which she reportedly takes "most days" His TSH few days ago continued to be elevated.   ROS Complaining of some fatigue Hands are very cold No lower extremity edema No cough She does feel shortness of breath sometimes which apparently is slightly more than baseline despite above , she is back doing tai chi   Current Medications (verified): 1)  Hydrochlorothiazide 25 Mg Tabs (Hydrochlorothiazide) .... Take 1 Tablet By Mouth 2)  Aspirin Ec 325 Mg Tbec (Aspirin) .... Take One Tablet By Mouth Daily 3)  Benazepril Hcl 40 Mg  Tabs (Benazepril Hcl) .Marland Kitchen.. 1 By Mouth Once Daily 4)  Amlodipine Besylate 10 Mg  Tabs (Amlodipine Besylate) .Marland Kitchen.. 1 By Mouth Once Daily 5)  Vitamin D3 5000 Unit Caps (Cholecalciferol) .... Take 1 Tab Every 3 Days 6)  Synthroid 25 Mcg Tabs (Levothyroxine Sodium) .Marland Kitchen.. 1 By Mouth Daily. Recheck Tsh in 6 Weeks.  Allergies (verified): No Known Drug Allergies  Past History:  Past Medical History: thyroid albation 6-11, now w/ hypothyroidism  Hyperlipidemia Hypertension hypothyroidism, status post treatment with iodine 06/2009 tachy brady syndrome per holter 2003, s/p pacemaker implantation 2003 in [place]--- pacemaker replaced 11-2009 h/o HYPERCALCEMIA 2000 ?TIA had a  Carotid u/s 1-15% and a brain MRI lacunar dz 2002: chronic neck pain MRI Cspine: DJD 2002: ECHO diast. disfx and (-) stres ECHO h/o diastolic dysfunction that later on improved        Past Surgical History: Reviewed history from 11/22/2008 and no changes required. Hysterectomy Tonsillectomy   Physical Exam  General:  alert and well-developed.   Neck:  no JVD at 45 Lungs:  normal respiratory effort, no intercostal retractions, no accessory muscle use, and normal breath sounds (few dry crackles at bases ?) Heart:  normal rate, regular rhythm, and no murmur.   Extremities:  no  lower extremity edema   Impression & Recommendations:  Problem # 1:  HYPOTHYROIDISM (ICD-244.9) Status post thyroid ablation 6-11, TSH was elevated  ~  11-22-09, patient was prescribed Synthroid which she reportedly takes "most days" His TSH few days ago continued to be elevated. she reportedly is feeling  fatigued and has cold hands, likely related to hypothyroidism She also sits just slightly more short of breath and usual, no evidence of volume overload. Observation for now Plan: Increase Synthroid from 25 to 50 micrograms daily. See instructions  The following medications were removed from the medication list:    Synthroid 25 Mcg Tabs (Levothyroxine sodium) .Marland Kitchen... 1 by mouth daily. recheck tsh in 6 weeks. Her updated medication list for this problem includes:    Synthroid 50 Mcg Tabs (Levothyroxine sodium) ..... One by mouth every day  Complete Medication List: 1)  Hydrochlorothiazide 25 Mg Tabs (Hydrochlorothiazide) .... Take 1 tablet by mouth 2)  Aspirin Ec 325 Mg Tbec (Aspirin) .... Take one tablet by mouth daily 3)  Benazepril Hcl 40 Mg Tabs (Benazepril hcl) .Marland KitchenMarland KitchenMarland Kitchen 1  by mouth once daily 4)  Amlodipine Besylate 10 Mg Tabs (Amlodipine besylate) .Marland Kitchen.. 1 by mouth once daily 5)  Vitamin D3 5000 Unit Caps (Cholecalciferol) .... Take 1 tab every 3 days 6)  Synthroid 50 Mcg Tabs (Levothyroxine sodium) .... One by mouth every day  Patient Instructions: 1)  increase Synthroid to 50 micrograms daily 2)  Come back fasting for your physical exam in 6-8 weeks, we'll recheck your levels  then Prescriptions: SYNTHROID 50 MCG TABS (LEVOTHYROXINE SODIUM) one by mouth every day  #30 x 4   Entered and Authorized by:   Nolon Rod. Jaamal Farooqui MD   Signed by:   Nolon Rod. Tamalyn Wadsworth MD on 01/26/2010   Method used:   Print then Give to Patient   RxID:   [id number]    Orders Added: 1)  Est. Patient Level III [14782]

## 2010-02-22 NOTE — Procedures (Signed)
Summary: wound check      Allergies Added: NKDA  Current Medications (verified): 1)  Hydrochlorothiazide 25 Mg Tabs (Hydrochlorothiazide) .... Take 1 Tablet By Mouth 2)  Aspirin Ec 325 Mg Tbec (Aspirin) .... Take One Tablet By Mouth Daily 3)  Benazepril Hcl 40 Mg  Tabs (Benazepril Hcl) .Marland Kitchen.. 1 By Mouth Once Daily 4)  Amlodipine Besylate 10 Mg  Tabs (Amlodipine Besylate) .Marland Kitchen.. 1 By Mouth Once Daily 5)  Vitamin D3 5000 Unit Caps (Cholecalciferol) .... Take 1 Tab Every 3 Days 6)  Synthroid 25 Mcg Tabs (Levothyroxine Sodium) .Marland Kitchen.. 1 By Mouth Daily. Recheck Tsh in 6 Weeks.  Allergies (verified): No Known Drug Allergies  PPM Specifications Following MD:  Sherryl Manges, MD     PPM Vendor:  Medtronic     PPM Model Number:  [id number]     PPM Serial Number:  [id number] PPM DOI:  12/18/2009     PPM Implanting MD:  Sherryl Manges, MD  Lead 1    Location: RA     DOI: 09/23/2001     Model #: [id number]     Serial #: [id number]     Status: active Lead 2    Location: RV     DOI: 09/23/2001     Model #: [id number]     Serial #: [id number]     Status: active  Magnet Response Rate:  BOL 85 ERI 65  Indications:  Sick sinus syndrome  Explantation Comments:  12/18/2009 Medtronic [id number] H explanted  PPM Follow Up Remote Check?  No Battery Voltage:  2.79 V     Battery Est. Longevity:  9.5 years     Pacer Dependent:  No       PPM Device Measurements Atrium  Amplitude: 5.6 mV, Impedance: 434 ohms, Threshold: 0.5 V at 0.4 msec Right Ventricle  Amplitude: 11.20 mV, Impedance: 526 ohms, Threshold: 0.75 V at 0.4 msec  Episodes MS Episodes:  0     Percent Mode Switch:  0     Coumadin:  No Ventricular High Rate:  0     Atrial Pacing:  98.5%     Ventricular Pacing:  1.3%  Parameters Mode:  DDDR     Lower Rate Limit:  60     Upper Rate Limit:  130 Paced AV Delay:  250     Sensed AV Delay:  220 Next Cardiology Appt Due:  03/22/2010 Tech Comments:  Steri strips removed by the patient, no redness or edema noted.    Outputs reprogrammed for chronic thresholds and rate response on.  ROV 3 months with Dr. Graciela Husbands. Altha Harm, LPN  January 04, 2010 4:46 PM

## 2010-03-06 ENCOUNTER — Encounter (INDEPENDENT_AMBULATORY_CARE_PROVIDER_SITE_OTHER): Payer: Medicare Other | Admitting: Internal Medicine

## 2010-03-06 ENCOUNTER — Encounter: Payer: Self-pay | Admitting: Internal Medicine

## 2010-03-06 DIAGNOSIS — I498 Other specified cardiac arrhythmias: Secondary | ICD-10-CM

## 2010-03-06 DIAGNOSIS — Z950 Presence of cardiac pacemaker: Secondary | ICD-10-CM

## 2010-03-14 NOTE — Assessment & Plan Note (Signed)
Summary: pacer ck mdt/amber/rs from bumplist/gd/kl      Allergies Added: NKDA  Visit Type:  device check Primary Provider:  Willow Ora, MD  CC:  tenderness around incision.  History of Present Illness:   Lori Trujillo is seen in [place] for sinus node dysfunction for which she is s.p pacer insertion, and for which she underwent device generator replacement in November 2011. Device pocket is well healed she has some pain around the device,  but it improves with eructation   She is doing pretty well followihg radioactive ablation of her thyroid;.    Current Medications (verified): 1)  Hydrochlorothiazide 25 Mg Tabs (Hydrochlorothiazide) .... Take 1 Tablet By Mouth 2)  Aspirin Ec 325 Mg Tbec (Aspirin) .... Take One Tablet By Mouth Daily 3)  Benazepril Hcl 40 Mg  Tabs (Benazepril Hcl) .Marland Kitchen.. 1 By Mouth Once Daily 4)  Amlodipine Besylate 10 Mg  Tabs (Amlodipine Besylate) .Marland Kitchen.. 1 By Mouth Once Daily 5)  Vitamin D3 5000 Unit Caps (Cholecalciferol) .... Take 1 Tab Every 3 Days 6)  Synthroid 50 Mcg Tabs (Levothyroxine Sodium) .... One By Mouth Every Day  Allergies (verified): No Known Drug Allergies  Past History:  Past Medical History: Last updated: 01/26/2010 thyroid albation 6-11, now w/ hypothyroidism  Hyperlipidemia Hypertension hypothyroidism, status post treatment with iodine 06/2009 tachy brady syndrome per holter 2003, s/p pacemaker implantation 2003 in [place]--- pacemaker replaced 11-2009 h/o HYPERCALCEMIA 2000 ?TIA had a  Carotid u/s 1-15% and a brain MRI lacunar dz 2002: chronic neck pain MRI Cspine: DJD 2002: ECHO diast. disfx and (-) stres ECHO h/o diastolic dysfunction that later on improved       Past Surgical History: Last updated: 11/22/2008 Hysterectomy Tonsillectomy   Family History: Last updated: 01/23/2009 no colon cancer no goiter or other thyroid problems.  Social History: Last updated: 11/21/2009 lives by self,  independent on her ADL ,  sometimes has  a difficult time getting in and out of the shower not driving anymore widow son died from a MI 2022-06-05 son and a daughter in [place] still does tai-chi  tobacco-- quit  ~2000, used to smoke 1/4 ppd x years   Vital Signs:  Patient profile:   77 year old female Height:      62 inches Weight:      109.75 pounds BMI:     20.15 Pulse rate:   84 / minute Resp:     18 per minute BP sitting:   195 / 95  (left arm) Cuff size:   regular  Vitals Entered By: Celestia Khat, CMA (March 06, 2010 12:17 PM)  Physical Exam  General:  The patient was alert and oriented in no acute distress. HEENT Normal.  Neck veins were flat, carotids were brisk.  Lungs were clear. Marked kyphosis Heart sounds were regular without murmurs or gallops.  device pocket well healed Abdomen was soft with active bowel sounds. There is no clubbing cyanosis or edema. Skin Warm and dry    PPM Specifications Following MD:  Sherryl Manges, MD     PPM Vendor:  Medtronic     PPM Model Number:  ADDR01     PPM Serial Number:  [id number] [facility] PPM DOI:  12/18/2009     PPM Implanting MD:  Sherryl Manges, MD  Lead 1    Location: RA     DOI: 09/23/2001     Model #: [id number]     Serial #: [id number]     Status: active Lead 2  Location: RV     DOI: 09/23/2001     Model #: [id number]     Serial #: [id number]     Status: active  Magnet Response Rate:  BOL 85 ERI 65  Indications:  Sick sinus syndrome  Explantation Comments:  12/18/2009 Medtronic [id number] H explanted  PPM Follow Up Pacer Dependent:  No      Episodes Coumadin:  No  Parameters Mode:  DDDR     Lower Rate Limit:  60     Upper Rate Limit:  130 Paced AV Delay:  250     Sensed AV Delay:  220  Impression & Recommendations:  Problem # 1:  SINOATRIAL NODE DYSFUNCTION (ICD-427.81) stable 100% atrial pacing Her updated medication list for this problem includes:    Aspirin Ec 325 Mg Tbec (Aspirin) .Marland Kitchen... Take one tablet by mouth daily    Benazepril Hcl 40 Mg Tabs (Benazepril  hcl) .Marland Kitchen... 1 by mouth once daily    Amlodipine Besylate 10 Mg Tabs (Amlodipine besylate) .Marland Kitchen... 1 by mouth once daily  Problem # 2:  .PACEMAKER, DDD  MDT (ICD-V45.01) Device parameters and data were reviewed and no changes were made  Problem # 3:  HYPERTENSION (ICD-401.9) review of blood pressures over the last year show that this and one other number which is very high are quite anomalous. I will have her followup with Dr. Phylis Bougie Her updated medication list for this problem includes:    Hydrochlorothiazide 25 Mg Tabs (Hydrochlorothiazide) .Marland Kitchen... Take 1 tablet by mouth    Aspirin Ec 325 Mg Tbec (Aspirin) .Marland Kitchen... Take one tablet by mouth daily    Benazepril Hcl 40 Mg Tabs (Benazepril hcl) .Marland Kitchen... 1 by mouth once daily    Amlodipine Besylate 10 Mg Tabs (Amlodipine besylate) .Marland Kitchen... 1 by mouth once daily  Problem # 4:  DIASTOLIC DYSFUNCTION (ICD-429.9) currently well compensated  Patient Instructions: 1)  Your physician recommends that you continue on your current medications as directed. Please refer to the Current Medication list given to you today. 2)  Your physician wants you to follow-up in: 9 MONTHS WITH DR Logan Bores will receive a reminder letter in the mail two months in advance. If you don't receive a letter, please call our office to schedule the follow-up appointment.

## 2010-03-20 NOTE — Cardiovascular Report (Signed)
Summary: Office Visit   Office Visit   Imported By: Roderic Ovens 03/16/2010 09:03:40  _____________________________________________________________________  External Attachment:    Type:   Image     Comment:   External Document

## 2010-03-23 ENCOUNTER — Encounter (INDEPENDENT_AMBULATORY_CARE_PROVIDER_SITE_OTHER): Payer: Medicare Other | Admitting: Internal Medicine

## 2010-03-23 ENCOUNTER — Other Ambulatory Visit: Payer: Self-pay | Admitting: Internal Medicine

## 2010-03-23 ENCOUNTER — Encounter: Payer: Self-pay | Admitting: Internal Medicine

## 2010-03-23 DIAGNOSIS — E039 Hypothyroidism, unspecified: Secondary | ICD-10-CM

## 2010-03-23 DIAGNOSIS — E785 Hyperlipidemia, unspecified: Secondary | ICD-10-CM

## 2010-03-23 DIAGNOSIS — Z0000 Encounter for general adult medical examination without abnormal findings: Secondary | ICD-10-CM

## 2010-03-23 DIAGNOSIS — I10 Essential (primary) hypertension: Secondary | ICD-10-CM

## 2010-03-23 DIAGNOSIS — Z23 Encounter for immunization: Secondary | ICD-10-CM

## 2010-03-23 LAB — TSH: TSH: 8.5 u[IU]/mL — ABNORMAL HIGH (ref 0.35–5.50)

## 2010-03-23 LAB — LIPID PANEL
HDL: 116.8 mg/dL (ref >39.00)
Total CHOL/HDL Ratio: 2
Triglycerides: 70 mg/dL (ref 0.0–149.0)
VLDL: 14 mg/dL (ref 0.0–40.0)

## 2010-03-23 LAB — LDL CHOLESTEROL, DIRECT: Direct LDL: 146.2 mg/dL

## 2010-03-29 NOTE — Assessment & Plan Note (Signed)
Summary: yearly exam/ph   Vital Signs:  Patient profile:   77 year old female Weight:      109.25 pounds Pulse rate:   68 / minute Pulse rhythm:   regular BP sitting:   122 / 86  (left arm) Cuff size:   regular  Vitals Entered By: Army Fossa CMA (March 23, 2010 10:25 AM) CC: CPX, fasting  Comments [facility] states she has pneumovax- but unsure  mammo due not had PAP    History of Present Illness: Here for Medicare AWV:  1.   Risk factors based on Past M, S, F history: reviewed  2.   Physical Activities: active, TaiChi -yoga, exercises for seniors  3.   Depression/mood: no problems reported or noted  4.   Hearing: no problems reported or noted  5.   ADL's: independent but having problems w/  carryng the  laundry basket  6.   Fall Risk: no recent falls  , prevention discussed  7.   Home Safety: does feel safe at home  8.   Height, weight, &visual acuity: see VS, vision nmot very good, will refer to ophtalmologist 9.   Counseling: yes  10.   Labs ordered based on risk factors: yes  11.           Referral Coordination, yes  12.           Care Plan, see a/p  13.            Cognitive Assessment, cognition is very good, motor skills very good for age    in addition, we discussed the following  thyroid albation 6-11, now w/ hypothyroidism -- reports good medication compliance except x a couple of doses   Hypertension-- good medication compliance but c/o urinating too much w/  diuretics   pacemaker-- routinely checked by cards    2000 ?TIA had a  Carotid u/s 1-15% and a brain MRI lacunar dz-- on ASA, good medication compliance        Preventive Screening-Counseling & Management  Caffeine-Diet-Exercise     Does Patient Exercise: yes     Times/week: 5  Current Medications (verified): 1)  Hydrochlorothiazide 25 Mg Tabs (Hydrochlorothiazide) .... Take 1 Tablet By Mouth 2)  Aspirin Ec 325 Mg Tbec (Aspirin) .... Take One Tablet By Mouth Daily 3)  Benazepril Hcl  40 Mg  Tabs (Benazepril Hcl) .Marland Kitchen.. 1 By Mouth Once Daily 4)  Amlodipine Besylate 10 Mg  Tabs (Amlodipine Besylate) .Marland Kitchen.. 1 By Mouth Once Daily 5)  Vitamin D3 5000 Unit Caps (Cholecalciferol) .... Take 1 Tab Every 3 Days 6)  Synthroid 50 Mcg Tabs (Levothyroxine Sodium) .... One By Mouth Every Day 7)  Dulcolax Stool Softener 100 Mg Caps (Docusate Sodium)  Allergies (verified): No Known Drug Allergies  Past History:  Past Medical History:  thyroid albation 6-11, now w/ hypothyroidism  Hyperlipidemia Hypertension hypothyroidism, status post treatment with iodine 06/2009 tachy brady syndrome per holter 2003, s/p pacemaker implantation 2003 in [place]--- pacemaker replaced 11-2009 h/o HYPERCALCEMIA 2000 ?TIA had a  Carotid u/s 1-15% and a brain MRI lacunar dz 2002: chronic neck pain MRI Cspine: DJD 2002: ECHO diast. disfx and (-) stres ECHO h/o diastolic dysfunction that later on improved  Past Surgical History: Reviewed history from 11/22/2008 and no changes required. Hysterectomy Tonsillectomy   Family History: no colon cancer breast ca--no no goiter or other thyroid problems.  Social History: Reviewed history from 11/21/2009 and no changes required. lives by self,  independent  on her ADL ,  sometimes has a difficult time getting in and out of the shower not driving anymore widow son died from a MI 06/01/2022 son and a daughter in [place] still does tai-chi  tobacco-- quit  ~2000, used to smoke 1/4 ppd x years Does Patient Exercise:  yes  Review of Systems CV:  Denies swelling of feet; no SSCP. Resp:  occasionally cough , mostly clearing her throat and sometimes SOB. GI:  Denies bloody stools, diarrhea, nausea, and vomiting.  Physical Exam  General:  alert, well-developed, and well-nourished.   Lungs:  normal respiratory effort, no intercostal retractions, no accessory muscle use, and normal breath sounds (few dry crackles at bases ?) Heart:  normal rate, regular rhythm, and no murmur.    Abdomen:  soft, non-tender, no distention, no masses, no guarding, and no rigidity.   Extremities:  no  lower extremity edema Psych:  Cognition and judgment appear intact. Alert and cooperative with normal attention span and concentration.  not anxious appearing and not depressed appearing.     Impression & Recommendations:  Problem # 1:  HEALTH SCREENING (ICD-V70.0)  Td 1-10 lat pneumonia shot?  today shingles shot info and a Rx provided    scope 11-2008, next per GI  last MMG  9-10 neg; never had an abnormal one  h/o hysterectomy for non malignant reasons; severall normal PAPs in the past we agreed on no further screening     order a  bone density test REFER TO OPHTALMOLOGIST D/T POOR VISION  encouraged to continue her healthy lifestyle  Orders: Medicare -1st Annual Wellness Visit [phone]) Ophthalmology Referral (Ophthalmology)  Problem # 2:  HYPERTENSION (ICD-401.9)  good medication compliance but c/o urinating too much  will decrease HCTZ in half and monitor BP /see instructions   Her updated medication list for this problem includes:    Amlodipine Besylate 10 Mg Tabs (Amlodipine besylate) .Marland Kitchen... 1 by mouth once daily    Hydrochlorothiazide 25 Mg Tabs (Hydrochlorothiazide) .Marland Kitchen... 1/2 tablet by mouth once daily    Benazepril Hcl 40 Mg Tabs (Benazepril hcl) .Marland Kitchen... 1 by mouth once daily  BP today: 122/86 Prior BP: 195/95 (03/06/2010)  Labs Reviewed: K+: 4.2 (12/13/2009) Creat: : 1.00 (12/13/2009)   Chol: 223 (02/19/2008)   HDL: 125.9 (02/19/2008)   LDL: DEL (02/19/2008)   TG: 41 (02/19/2008)  Problem # 3:  HYPOTHYROIDISM (ICD-244.9)  Her updated medication list for this problem includes:    Synthroid 50 Mcg Tabs (Levothyroxine sodium) ..... One by mouth every day  Orders: TLB-TSH (Thyroid Stimulating Hormone) (84443-TSH) Specimen Handling (95621)  Labs Reviewed: TSH: 30.07 (01/23/2010)    Chol: 223 (02/19/2008)   HDL: 125.9 (02/19/2008)   LDL: DEL (02/19/2008)    TG: 41 (02/19/2008)  Problem # 4:  HYPERCALCEMIA (ICD-275.42)  remote history of hypercalcemia, check a vitamin D Orders: T-Vitamin D (25-Hydroxy) (30865-78469) Specimen Handling (62952) Radiology Referral (Radiology)  Problem # 5:  HYPERLIPIDEMIA (ICD-272.4)  Orders: Venipuncture (84132) TLB-Lipid Panel (80061-LIPID) Specimen Handling (44010)  Labs Reviewed: SGOT: 18 (12/29/2008)   SGPT: 8 (12/29/2008)   HDL:125.9 (02/19/2008), 131.3 (03/09/2007)  LDL:DEL (02/19/2008), DEL (03/09/2007)  Chol:223 (02/19/2008), 246 (03/09/2007)  Trig:41 (02/19/2008), 60 (03/09/2007)  Complete Medication List: 1)  Amlodipine Besylate 10 Mg Tabs (Amlodipine besylate) .Marland Kitchen.. 1 by mouth once daily 2)  Hydrochlorothiazide 25 Mg Tabs (Hydrochlorothiazide) .... 1/2 tablet by mouth once daily 3)  Benazepril Hcl 40 Mg Tabs (Benazepril hcl) .Marland Kitchen.. 1 by mouth once daily 4)  Aspirin Ec 325  Mg Tbec (Aspirin) .... Take one tablet by mouth daily 5)  Vitamin D3 5000 Unit Caps (Cholecalciferol) .... Take 1 tab every 3 days 6)  Synthroid 50 Mcg Tabs (Levothyroxine sodium) .... One by mouth every day 7)  Dulcolax Stool Softener 100 Mg Caps (Docusate sodium) 8)  Zostavax 78295 Unt/0.32ml Solr (Zoster vaccine live) .Marland Kitchen.. 1 inj subcutaneously.  Other Orders: Pneumococcal Vaccine (62130) Admin 1st Vaccine (86578)  Patient Instructions: 1)  hydrochlorothiazide 25mg  1/2 tab only 2)  Check your blood pressure 2 or 3 times a week. If it is more than 140/85 consistently,please let us know  3)  Please schedule a follow-up appointment in 6 months .  Prescriptions: ZOSTAVAX 46962 UNT/0.65ML SOLR (ZOSTER VACCINE LIVE) 1 inj Subcutaneously.  #1 x 0   Entered by:   Army Fossa CMA   Authorized by:   Nolon Rod. Glendale Youngblood MD   Signed by:   Army Fossa CMA on 03/23/2010   Method used:   Print then Give to Patient   RxID:   [id number]    Orders Added: 1)  Venipuncture [53664] 2)  TLB-Lipid Panel [80061-LIPID] 3)   T-Vitamin D (25-Hydroxy) [40347-42595] 4)  TLB-TSH (Thyroid Stimulating Hormone) [84443-TSH] 5)  Pneumococcal Vaccine [90732] 6)  Admin 1st Vaccine [90471] 7)  Specimen Handling [99000] 8)  Radiology Referral [Radiology] 9)  Est. Patient Level III [63875] 10)  Medicare -1st Annual Wellness Visit [G0438] 11)  Ophthalmology Referral [Ophthalmology]   Immunizations Administered:  Pneumonia Vaccine:    Vaccine Type: Pneumovax    Site: left deltoid    Mfr: Merck    Dose: 0.5 ml    Route: IM    Given by: Army Fossa CMA    Exp. Date: 05/23/2011    Lot #: [id number]   Immunizations Administered:  Pneumonia Vaccine:    Vaccine Type: Pneumovax    Site: left deltoid    Mfr: Merck    Dose: 0.5 ml    Route: IM    Given by: Army Fossa CMA    Exp. Date: 05/23/2011    Lot #: [id number]   Risk Factors:  Alcohol use:  no Exercise:  yes    Times per week:  5

## 2010-04-02 ENCOUNTER — Encounter: Payer: Self-pay | Admitting: Internal Medicine

## 2010-04-02 ENCOUNTER — Encounter (INDEPENDENT_AMBULATORY_CARE_PROVIDER_SITE_OTHER): Payer: Self-pay | Admitting: *Deleted

## 2010-04-06 ENCOUNTER — Telehealth (INDEPENDENT_AMBULATORY_CARE_PROVIDER_SITE_OTHER): Payer: Self-pay | Admitting: *Deleted

## 2010-04-10 NOTE — Letter (Signed)
Summary: Unable To Reach-Consult Scheduled  [facility]  [address] [place], [place] [address]   Phone: [phone]  Fax: [phone]    04/02/2010 MRN: [id number]    Dear Ms. Mealor,   We have been unable to reach you by phone.  Please contact our office with an updated phone number.   If you have any question please call us.     Thank you,  Army Fossa CMA  April 02, 2010 4:05 PM

## 2010-04-10 NOTE — Progress Notes (Signed)
Summary: phone numbers are correct  Phone Note Call from Patient Call back at Home Phone [phone] Call back at Work Phone [phone]   Caller: patient's son Minerva Areola Summary of Call: patient's son Minerva Areola called to report that home number =[phone] is correct (but she is never there) and cell number is [phone] is correct, but she doesnt hear it ring      Son Minerva Areola left his home number to add to his cell as her contact person Initial call taken by: Jerolyn Shin,  April 06, 2010 4:42 PM

## 2010-04-19 NOTE — Miscellaneous (Signed)
Summary: Zostavax/[facility]   Imported By: Maryln Gottron 04/09/2010 12:31:08  _____________________________________________________________________  External Attachment:    Type:   Image     Comment:   External Document

## 2010-04-25 ENCOUNTER — Telehealth: Payer: Self-pay | Admitting: Internal Medicine

## 2010-04-25 NOTE — Telephone Encounter (Signed)
Bone density test from 04-03-10 showed osteoporosis with a T score of -3.2. She has a normal renal function, vitamin D normal, hypothyroidism slightly under control. Advise patient: Recommend exercise, calcium, vitamin D and medication. We could call Fosamax 70 mg weekly or Actonel at 150 mg monthly. If the  patient is willing to take medication, please discuss precautions. F/u as planned

## 2010-04-25 NOTE — Telephone Encounter (Signed)
Message left for patient to return my call.  

## 2010-04-25 NOTE — Telephone Encounter (Signed)
I spoke w/ pt she is aware, would prefer to wait on the prescription med.

## 2010-04-30 ENCOUNTER — Encounter: Payer: Self-pay | Admitting: Internal Medicine

## 2010-06-07 ENCOUNTER — Telehealth: Payer: Self-pay | Admitting: Internal Medicine

## 2010-06-07 NOTE — Telephone Encounter (Signed)
Message left for patient to return my call.  

## 2010-06-07 NOTE — Telephone Encounter (Signed)
Spoke w/ pt son says that he will call pt to let her know and call office back in the morning to schedule.

## 2010-06-07 NOTE — Telephone Encounter (Signed)
Please call the patient or her son, she is due for a TSH, DX  hypothyroidism. For contact information, see [employer]

## 2010-06-08 NOTE — Assessment & Plan Note (Signed)
[facility]                         ELECTROPHYSIOLOGY OFFICE NOTE   Lori Trujillo, Lori Trujillo                      MRN:          [id number]  DATE:01/01/2007                            DOB:          07-17-1921    HISTORY OF PRESENT ILLNESS:  Lori Trujillo was seen on December 8 for her  pacemaker check.  She had no concern at that point.  Her medications  were notable for the intercurrent change from Vytorin, Simvastatin and  the down titration of her aspirin from 325 to 81.   PHYSICAL EXAMINATION:  VITAL SIGNS:  Blood pressure 155/80, pulse 95.  LUNGS:  Clear.  HEART:  Sounds were regular.  EXTREMITIES:  Without edema.   Interrogation of her Medtronic Kappa 701 pulse generator demonstrates a  non-intrinsic atrial rhythm.  The impedance was 481, threshold was 0.5  and 0.4.  The R wave was 11 with impedance of 689, threshold of 0.5 and  0.4.   IMPRESSION:  1. Sinus node dysfunction.  2. Bradycardia.  3. Status post pacer for the above.  4. Normal left ventricular function.   Lori Trujillo is doing quiet well.  We will plan to see her again in one  years time.     Duke Salvia, MD, [facility]  Electronically Signed    SCK/MedQ  DD: 01/01/2007  DT: 01/02/2007  Job #: [id number]   cc:   Willow Ora, MD

## 2010-06-08 NOTE — Letter (Signed)
November 29, 2005    Willow Ora, MD  [phone] [place], [place] [address]   RE:  KLANI, CARIDI  MRN:  [id number]  /  DOB:  10-Aug-1921   Dear Elita Quick:   Ms. Sobel comes in.  She is status post pacemaker implantation for  bradycardia in the setting of hypertension.  She has a history of paroxysmal  atrial fibrillation by note, although I do not see that recently recorded.   She has recently been identified as having hypercalcemia and maybe  hypokalemia.  I wonder whether the latter is related to her  hydrochlorothiazide.   Her other medications include Vytorin, Exforge, and aspirin.   On examination, her blood pressure today, unfortunately I do not have it in  front of me.  Her lungs were clear, heart sounds were regular.   Interrogation of her Medtronic Kappa 701 pulse generator demonstrates a P  wave of 2.8 with impedance of 481, a threshold of 0.5 at 0.4.  The R wave is  11.2 with impedance of 673, a threshold of 0.75 at 0.46.   IMPRESSION:  1. Tachybrady syndrome.  2. Status post pacer for the above.  3. Hypertension.  4. Electrolyte abnormalities as noted.   Jose, we will plan to follow Ms. Garms's pacemaker in the device clinic  quarterly, and then we will see her in the device clinic in 1 year.    Sincerely,      Duke Salvia, MD, [facility]  Electronically Signed    SCK/MedQ  DD: 11/29/2005  DT: 11/29/2005  Job #: [id number]

## 2010-06-12 ENCOUNTER — Other Ambulatory Visit: Payer: Self-pay | Admitting: *Deleted

## 2010-06-12 DIAGNOSIS — E039 Hypothyroidism, unspecified: Secondary | ICD-10-CM

## 2010-06-13 ENCOUNTER — Other Ambulatory Visit (INDEPENDENT_AMBULATORY_CARE_PROVIDER_SITE_OTHER): Payer: Medicare Other

## 2010-06-13 DIAGNOSIS — E039 Hypothyroidism, unspecified: Secondary | ICD-10-CM

## 2010-06-19 ENCOUNTER — Telehealth: Payer: Self-pay | Admitting: *Deleted

## 2010-06-19 NOTE — Telephone Encounter (Signed)
Message copied by Leanne Lovely on Tue Jun 19, 2010 11:53 AM ------      Message from: Willow Ora E      Created: Mon Jun 18, 2010 12:43 PM       Please confirm that she is taking Synthroid 75 mcg as prescribed. If she is, increase Synthroid to 88 mcg daily. We'll recheck her TSH when she comes back September 2012 for a routine checkup

## 2010-06-19 NOTE — Telephone Encounter (Signed)
Left message for pts daughter to call back. Sons- VM was full Pts had no VM.

## 2010-06-20 MED ORDER — LEVOTHYROXINE SODIUM 88 MCG PO TABS: 88.0000 ug | ORAL_TABLET | Freq: Every day | ORAL | Status: DC

## 2010-06-20 NOTE — Telephone Encounter (Signed)
Message left for patient to return my call.  

## 2010-06-20 NOTE — Telephone Encounter (Signed)
Pt daughter returned call said that we could give mother a call since she is at home.  Spoke w/ pt says she has been taking medication regularly informed that medication to be sent to pharmacy.

## 2010-06-26 ENCOUNTER — Encounter: Payer: Medicare Other | Admitting: *Deleted

## 2010-07-03 ENCOUNTER — Encounter: Payer: Medicare Other | Admitting: *Deleted

## 2010-07-11 ENCOUNTER — Other Ambulatory Visit: Payer: Self-pay | Admitting: Internal Medicine

## 2010-07-11 DIAGNOSIS — I6529 Occlusion and stenosis of unspecified carotid artery: Secondary | ICD-10-CM

## 2010-07-12 ENCOUNTER — Encounter (INDEPENDENT_AMBULATORY_CARE_PROVIDER_SITE_OTHER): Payer: Medicare Other | Admitting: *Deleted

## 2010-07-12 DIAGNOSIS — I6529 Occlusion and stenosis of unspecified carotid artery: Secondary | ICD-10-CM

## 2010-07-26 ENCOUNTER — Encounter: Payer: Self-pay | Admitting: Internal Medicine

## 2010-09-28 ENCOUNTER — Ambulatory Visit (INDEPENDENT_AMBULATORY_CARE_PROVIDER_SITE_OTHER): Payer: Medicare Other | Admitting: Internal Medicine

## 2010-09-28 ENCOUNTER — Other Ambulatory Visit: Payer: Self-pay | Admitting: Internal Medicine

## 2010-09-28 ENCOUNTER — Encounter: Payer: Self-pay | Admitting: Internal Medicine

## 2010-09-28 DIAGNOSIS — E039 Hypothyroidism, unspecified: Secondary | ICD-10-CM

## 2010-09-28 DIAGNOSIS — E785 Hyperlipidemia, unspecified: Secondary | ICD-10-CM

## 2010-09-28 DIAGNOSIS — I10 Essential (primary) hypertension: Secondary | ICD-10-CM

## 2010-09-28 DIAGNOSIS — R350 Frequency of micturition: Secondary | ICD-10-CM

## 2010-09-28 LAB — POCT URINALYSIS DIPSTICK
Bilirubin, UA: NEGATIVE
Glucose, UA: NEGATIVE
Leukocytes, UA: NEGATIVE
pH, UA: 7.5

## 2010-09-28 NOTE — Patient Instructions (Signed)
Take your BP meds every day Check the  blood pressure 2 or 3 times a week, be sure it is less than 140/85. If it is consistently higher, let me know Also call if the urinary frecuency continue

## 2010-09-28 NOTE — Progress Notes (Signed)
  Subjective:    Patient ID: Lori Trujillo, female    DOB: 03-13-1921, 77 y.o.   MRN: [id number]  HPI Routine office visit Her only complaint today is urinary frequency, this is going on for a few months, she thinks is related to one of her many medicines  but cannot tell me which one. She has been on diuretics for years. Hypercalcemia, records reviewed, see below hypothyroidism due for labs   Past Medical History: Hyperlipidemia Hypertension hypothyroidism, status post treatment with iodine 06/2009 tachy brady syndrome per holter 2003, s/p pacemaker implantation 2003 in [place]--- pacemaker replaced 11-2009 h/o HYPERCALCEMIA 2000 ?TIA had a  Carotid u/s 1-15% and a brain MRI lacunar dz 2002: chronic neck pain MRI Cspine: DJD 2002: ECHO diast. disfx and (-) stres ECHO h/o diastolic dysfunction that later on improved  Past Surgical History: Reviewed history from 11/22/2008 and no changes required. Hysterectomy Tonsillectomy    Review of Systems Denies any chest pain, shortness of breath is at baseline No lower extremity edema BP today slightly elevated, but needs that sometimes forgets to take her medicines. Denies any difficulty urinating, dysuria or gross hematuria.    Objective:   Physical Exam  Constitutional: She is oriented to person, place, and time. She appears well-developed and well-nourished.  Cardiovascular: Normal rate, regular rhythm and normal heart sounds.   No murmur heard. Pulmonary/Chest:       Few crackles at bases? Not a new finding  Musculoskeletal: She exhibits no edema.  Neurological: She is alert and oriented to person, place, and time.          Assessment & Plan:

## 2010-09-28 NOTE — Assessment & Plan Note (Signed)
Was seen by Dr. Talmage Nap 12-2006, diagnosed with mild hyperparathyroidism, she order a bone density test, no records available. We are rechecking a BMP today. Order a bone density test

## 2010-09-28 NOTE — Assessment & Plan Note (Addendum)
BP elevated today, but admits to missing doses of medicines. She complains of urinary frequency, patient thinks is related to medication however she has been on diuretics x a while, see below. Plan: good compliance and check amb BPs

## 2010-09-28 NOTE — Assessment & Plan Note (Signed)
Based on the last TSH, her Synthroid was increased to 88 mcg. Labs

## 2010-09-28 NOTE — Assessment & Plan Note (Signed)
Moderate elevation of her cholesterol, in the past simvastatin caused aches, recheck labs, Pravachol ?

## 2010-09-28 NOTE — Assessment & Plan Note (Addendum)
Unclear etiology, check a UA and UCX If no infection found , we could: D/c diuretic and see how she does or send her to urology

## 2010-10-01 ENCOUNTER — Other Ambulatory Visit (INDEPENDENT_AMBULATORY_CARE_PROVIDER_SITE_OTHER): Payer: Medicare Other

## 2010-10-01 DIAGNOSIS — E785 Hyperlipidemia, unspecified: Secondary | ICD-10-CM

## 2010-10-01 DIAGNOSIS — I10 Essential (primary) hypertension: Secondary | ICD-10-CM

## 2010-10-02 ENCOUNTER — Encounter: Payer: Self-pay | Admitting: *Deleted

## 2010-10-02 LAB — CULTURE, URINE COMPREHENSIVE: Colony Count: 75000

## 2010-10-02 LAB — BASIC METABOLIC PANEL
BUN: 19 mg/dL (ref 6–23)
Calcium: 10.2 mg/dL (ref 8.4–10.5)
Creatinine, Ser: 0.8 mg/dL (ref 0.4–1.2)
GFR: 83.6 mL/min (ref >60.00)
Glucose, Bld: 93 mg/dL (ref 70–99)
Potassium: 4.4 mEq/L (ref 3.5–5.1)

## 2010-10-02 LAB — LIPID PANEL
Cholesterol: 286 mg/dL — ABNORMAL HIGH (ref 0–200)
VLDL: 9.6 mg/dL (ref 0.0–40.0)

## 2010-10-03 ENCOUNTER — Other Ambulatory Visit: Payer: Self-pay | Admitting: *Deleted

## 2010-10-03 ENCOUNTER — Telehealth: Payer: Self-pay | Admitting: *Deleted

## 2010-10-03 DIAGNOSIS — N390 Urinary tract infection, site not specified: Secondary | ICD-10-CM

## 2010-10-03 MED ORDER — LEVOFLOXACIN 250 MG PO TABS: 250.0000 mg | ORAL_TABLET | Freq: Every day | ORAL | Status: AC

## 2010-10-03 NOTE — Telephone Encounter (Signed)
Attempted to contact patient via home phone [no answering machine] & mobile [VM full-no messages allowed]. Daughter & Son listed on HIPAA [in [employer]]. LMOM for daughter, Epifanio Lesches, to inform of patient's lab results: UTI, ABX sent to pharmacy; Urine Culture in one month to schedule [lab order placed in EMR].

## 2010-10-04 ENCOUNTER — Telehealth: Payer: Self-pay

## 2010-10-04 NOTE — Telephone Encounter (Signed)
Message copied by Beverely Low on Thu Oct 04, 2010  2:07 PM ------      Message from: Willow Ora E      Created: Wed Oct 03, 2010 10:04 PM       TSH continued to be slightly elevated despite increasing Synthroid, compliance? ;   cholesterol continue  to be elevated but essentially stable, she is 67 and has a history of previous intolerance to simvastatin. Given age, I think I won'to try to introduce more statins      --------------------------------------      Advise patient's son (he helps her with her medicines) :      Change   Synthroid from 88 to 100 mcg daily. Call in a prescription. TSH in 6 weeks.

## 2010-10-04 NOTE — Telephone Encounter (Signed)
Left message for son, Minerva Areola, to call back

## 2010-10-05 ENCOUNTER — Telehealth: Payer: Self-pay

## 2010-10-05 NOTE — Telephone Encounter (Signed)
Message copied by Beverely Low on Fri Oct 05, 2010  1:09 PM ------      Message from: Lori Trujillo      Created: Wed Oct 03, 2010 10:04 PM       TSH continued to be slightly elevated despite increasing Synthroid, compliance? ;   cholesterol continue  to be elevated but essentially stable, she is 74 and has a history of previous intolerance to simvastatin. Given age, I think I won'to try to introduce more statins      --------------------------------------      Advise patient's son (he helps her with her medicines) :      Change   Synthroid from 88 to 100 mcg daily. Call in a prescription. TSH in 6 weeks.

## 2010-10-05 NOTE — Telephone Encounter (Signed)
Several attempts at calling pt's contact numbers. The home phone continues to be busy and the mobile goes straight to a voicemail that has not been set up, therefore I am unable to leave a message.   Left a message on son, Ladona Horns, voicemail to return call to office

## 2010-10-10 NOTE — Progress Notes (Signed)
Quick Note:  Several attempts to reach pt and son to no avail. Pt's home phone has no answering machine to ever pick up and her cell phone has a full voicemail. I have left several messages for son to call back ______

## 2010-10-11 ENCOUNTER — Telehealth: Payer: Self-pay

## 2010-10-11 MED ORDER — LEVOTHYROXINE SODIUM 100 MCG PO TABS: 100.0000 ug | ORAL_TABLET | Freq: Every day | ORAL | Status: DC

## 2010-10-11 NOTE — Progress Notes (Signed)
Quick Note:  Attempted to call pt and son again to no avail. Labs and instructions mailed and Rx sent to pharmacy ______

## 2010-10-11 NOTE — Telephone Encounter (Signed)
Message copied by Beverely Low on Thu Oct 11, 2010 12:54 PM ------      Message from: Lori Trujillo      Created: Wed Oct 03, 2010 10:04 PM       TSH continued to be slightly elevated despite increasing Synthroid, compliance? ;   cholesterol continue  to be elevated but essentially stable, she is 60 and has a history of previous intolerance to simvastatin. Given age, I think I won'to try to introduce more statins      --------------------------------------      Advise patient's son (he helps her with her medicines) :      Change   Synthroid from 88 to 100 mcg daily. Call in a prescription. TSH in 6 weeks.

## 2010-10-11 NOTE — Telephone Encounter (Signed)
Returned call from pt's son.  Attempted to call. Left message for son, Minerva Areola, to call back

## 2010-10-11 NOTE — Telephone Encounter (Signed)
Pt's son is aware of lab results

## 2010-10-11 NOTE — Telephone Encounter (Signed)
Another attempt at reaching pt or son. Labs mailed along with MD's recommendations. Rx sent to pharmacy

## 2010-11-26 ENCOUNTER — Encounter: Payer: Self-pay | Admitting: Internal Medicine

## 2010-11-26 ENCOUNTER — Ambulatory Visit (INDEPENDENT_AMBULATORY_CARE_PROVIDER_SITE_OTHER): Payer: Medicare Other | Admitting: Internal Medicine

## 2010-11-26 DIAGNOSIS — R9431 Abnormal electrocardiogram [ECG] [EKG]: Secondary | ICD-10-CM

## 2010-11-26 DIAGNOSIS — Z950 Presence of cardiac pacemaker: Secondary | ICD-10-CM

## 2010-11-26 DIAGNOSIS — I10 Essential (primary) hypertension: Secondary | ICD-10-CM

## 2010-11-26 DIAGNOSIS — I495 Sick sinus syndrome: Secondary | ICD-10-CM

## 2010-11-26 LAB — PACEMAKER DEVICE OBSERVATION
AL THRESHOLD: 0.25 V
ATRIAL PACING PM: 92
BAMS-0001: 175 {beats}/min
DEVICE MODEL PM: 565368
RV LEAD THRESHOLD: 0.75 V
VENTRICULAR PACING PM: 0

## 2010-11-26 NOTE — Patient Instructions (Signed)

## 2010-11-26 NOTE — Assessment & Plan Note (Signed)
With no sympmtoms will not pursue

## 2010-11-26 NOTE — Progress Notes (Signed)
  HPI  Lori Trujillo is a 77 y.o. female is seen in [place] for sinus node dysfunction for which she is s.p pacer insertion, and for which she underwent device generator replacement in November 2011. Device pocket is well healed she has some pain around the device, but it improves with eructation   The patient denies chest pain, shortness of breath, nocturnal dyspnea, orthopnea or peripheral edema.  There have been no palpitations, lightheadedness or syncope.   She is active daily at the elderly center  Past Medical History  Diagnosis Date  . Hyperlipidemia   . Hypertension   . Hypothyroidism 6/11    Thyroid ablation, s/p treatment with iodine  . Tachy-brady syndrome 2003    Per holter, s/p pacemaker implantation in [place] (pacemaker replaced 11/11)  . Hypercalcemia   . TIA (transient ischemic attack) 2000    ?TIA; carotid u/s 1-15% and brain MRI lacunar dz  . Chronic neck pain 2002  . DJD (degenerative joint disease) of cervical spine 2002    MRI Cspine  . Echocardiogram shows left ventricular diastolic dysfunction 2002    And (-) stress ECHO (diastolic dysfunction later improved)    Past Surgical History  Procedure Date  . Abdominal hysterectomy   . Tonsillectomy     Current Outpatient Prescriptions  Medication Sig Dispense Refill  . amLODipine (NORVASC) 10 MG tablet Take 10 mg by mouth daily.        Marland Kitchen aspirin EC 325 MG tablet Take 325 mg by mouth daily.        . benazepril (LOTENSIN) 40 MG tablet Take 40 mg by mouth daily.        . cholecalciferol (VITAMIN D) 1000 UNITS tablet Take 1,000 Units by mouth daily.        Marland Kitchen docusate sodium (COLACE) 100 MG capsule Take by mouth as needed.       . hydrochlorothiazide 25 MG tablet Take 12.5 mg by mouth daily.        Marland Kitchen levothyroxine (SYNTHROID) 100 MCG tablet Take 1 tablet (100 mcg total) by mouth daily.  30 tablet  3    No Known Allergies  Review of Systems negative except from HPI and PMH  Physical Exam Well developed and  well nourished in no acute distress HENT normal E scleral and icterus clear Neck Supple JVP flat; carotids brisk and full Clear to ausculation Regular rate and rhythm,S4 early systolic murmur and a widely split S2 Soft with active bowel sounds No clubbing cyanosis and edema Alert and oriented, grossly normal motor and sensory function;walks with a cane Skin Warm and Dry  ECG Atrial pace at 67 there is evidence of left ventricular hypertrophy with T-wave inversions from V2 to V5. I looked in all sources; and can not find an electrocardiogram  Assessment and  Plan

## 2010-11-26 NOTE — Assessment & Plan Note (Signed)
100% atrial paced  

## 2010-11-26 NOTE — Assessment & Plan Note (Signed)
The patient's device was interrogated.  The information was reviewed. No changes were made in the programming.    

## 2010-11-26 NOTE — Assessment & Plan Note (Signed)
Well controlled 

## 2010-12-28 ENCOUNTER — Encounter: Payer: Self-pay | Admitting: Internal Medicine

## 2010-12-31 ENCOUNTER — Encounter: Payer: Self-pay | Admitting: Internal Medicine

## 2010-12-31 ENCOUNTER — Ambulatory Visit (INDEPENDENT_AMBULATORY_CARE_PROVIDER_SITE_OTHER): Payer: Medicare Other | Admitting: Internal Medicine

## 2010-12-31 VITALS — BP 118/60 | HR 69 | Ht 63.0 in | Wt 105.0 lb

## 2010-12-31 DIAGNOSIS — E039 Hypothyroidism, unspecified: Secondary | ICD-10-CM

## 2010-12-31 DIAGNOSIS — E785 Hyperlipidemia, unspecified: Secondary | ICD-10-CM

## 2010-12-31 DIAGNOSIS — N390 Urinary tract infection, site not specified: Secondary | ICD-10-CM

## 2010-12-31 DIAGNOSIS — I10 Essential (primary) hypertension: Secondary | ICD-10-CM

## 2010-12-31 DIAGNOSIS — R350 Frequency of micturition: Secondary | ICD-10-CM

## 2010-12-31 NOTE — Progress Notes (Signed)
  Subjective:    Patient ID: Lori Trujillo, female    DOB: January 14, 1922, 77 y.o.   MRN: [id number]  HPI Three-month followup  Past Medical History: Hyperlipidemia Hypertension hypothyroidism, status post treatment with iodine 06/2009 tachy brady syndrome per holter 2003, s/p pacemaker implantation 2003 in [place]--- pacemaker replaced 11-2009 h/o HYPERCALCEMIA 2000 ?TIA had a  Carotid u/s 1-15% and a brain MRI lacunar dz 2002: chronic neck pain MRI Cspine: DJD 2002: ECHO diast. disfx and (-) stres ECHO h/o diastolic dysfunction that later on improved  Past Surgical History: Hysterectomy Tonsillectomy    Family History: no colon cancer breast ca--no no goiter or other thyroid problems.  Social History: lives by self,  independent on her ADL ,  sometimes has a difficult time getting in and out of the shower not driving anymore widow son died from a MI 06/12/2022 son and a daughter in [place] still does tai-chi  tobacco-- quit ~2000, used to smoke 1/4 ppd x years   Review of Systems Since the last office visit, we recommend to increase her thyroid dose and we treated a UTI. Good compliance with advice. Urinary frequency decrease. Hypertension with good ambulatory BPs. Reports she had a flu shot already and she got a Zostavax.      Objective:   Physical Exam  Constitutional: She is oriented to person, place, and time. She appears well-developed and well-nourished.  Cardiovascular: Normal rate, regular rhythm and normal heart sounds.   Pulmonary/Chest: Effort normal.       Few crackles at bases,  Not a new finding  Musculoskeletal: She exhibits no edema.  Neurological: She is alert and oriented to person, place, and time.       Assessment & Plan:

## 2010-12-31 NOTE — Assessment & Plan Note (Addendum)
Cholesterol continued to be elevated, given advanced age and previous history of poor tolerance to statins, i decided not to pursue further treatment

## 2010-12-31 NOTE — Assessment & Plan Note (Signed)
Since the last OV she was found to have a UTI and treated. Sx are Improved.

## 2010-12-31 NOTE — Assessment & Plan Note (Signed)
Well controlled 

## 2010-12-31 NOTE — Assessment & Plan Note (Signed)
Due for labs

## 2011-01-01 LAB — TSH: TSH: 0.39 u[IU]/mL (ref 0.35–5.50)

## 2011-01-02 LAB — URINE CULTURE: Colony Count: >=100000

## 2011-01-10 ENCOUNTER — Encounter: Payer: Self-pay | Admitting: Internal Medicine

## 2011-02-28 ENCOUNTER — Encounter: Payer: Medicare Other | Admitting: *Deleted

## 2011-03-04 ENCOUNTER — Encounter: Payer: Self-pay | Admitting: *Deleted

## 2011-03-15 ENCOUNTER — Other Ambulatory Visit: Payer: Self-pay | Admitting: Internal Medicine

## 2011-03-15 NOTE — Telephone Encounter (Signed)
Refill done.  

## 2011-03-22 DIAGNOSIS — J189 Pneumonia, unspecified organism: Secondary | ICD-10-CM

## 2011-03-22 HISTORY — DX: Pneumonia, unspecified organism: J18.9

## 2011-03-27 ENCOUNTER — Telehealth: Payer: Self-pay | Admitting: Internal Medicine

## 2011-03-27 NOTE — Telephone Encounter (Signed)
No need for labs, needs a "yearly check up" this month, no urgent, no need to fast for the visit

## 2011-03-27 NOTE — Telephone Encounter (Signed)
Patient called wanting to set up follow up labs. I see in Dec. 2012, her labs were fine & no further action was required. Do I still need to allow her to set up TSH Labs, or does she just need to come in for a follow up visit? I can call her back  Thanks Dennie Bible # [phone]

## 2011-03-27 NOTE — Telephone Encounter (Signed)
Please advise 

## 2011-03-28 NOTE — Telephone Encounter (Signed)
Can you call this pt back & set up an OV? Thank you!

## 2011-03-28 NOTE — Telephone Encounter (Signed)
Spoke to ms Dimare @ 3.15pm she is coming in @ 2.45 3/13

## 2011-03-28 NOTE — Telephone Encounter (Signed)
Attempted to contact patient several times, no voicemail will continue to contact her.

## 2011-04-03 ENCOUNTER — Ambulatory Visit (HOSPITAL_BASED_OUTPATIENT_CLINIC_OR_DEPARTMENT_OTHER)
Admission: RE | Admit: 2011-04-03 | Discharge: 2011-04-03 | Disposition: A | Discharge status: Home / Self Care | Payer: Medicare Other | Source: Ambulatory Visit | Attending: Internal Medicine | Admitting: Internal Medicine

## 2011-04-03 ENCOUNTER — Ambulatory Visit (INDEPENDENT_AMBULATORY_CARE_PROVIDER_SITE_OTHER): Payer: Medicare Other | Admitting: Internal Medicine

## 2011-04-03 VITALS — BP 130/82 | HR 62 | Temp 98.4°F | Wt 102.0 lb

## 2011-04-03 DIAGNOSIS — R079 Chest pain, unspecified: Secondary | ICD-10-CM

## 2011-04-03 DIAGNOSIS — J189 Pneumonia, unspecified organism: Secondary | ICD-10-CM

## 2011-04-03 DIAGNOSIS — R05 Cough: Secondary | ICD-10-CM

## 2011-04-03 DIAGNOSIS — R0609 Other forms of dyspnea: Secondary | ICD-10-CM

## 2011-04-03 DIAGNOSIS — E039 Hypothyroidism, unspecified: Secondary | ICD-10-CM

## 2011-04-03 DIAGNOSIS — R918 Other nonspecific abnormal finding of lung field: Secondary | ICD-10-CM

## 2011-04-03 DIAGNOSIS — R059 Cough, unspecified: Secondary | ICD-10-CM | POA: Insufficient documentation

## 2011-04-03 DIAGNOSIS — I517 Cardiomegaly: Secondary | ICD-10-CM | POA: Insufficient documentation

## 2011-04-03 MED ORDER — MOXIFLOXACIN HCL 400 MG PO TABS: 400.0000 mg | ORAL_TABLET | Freq: Every day | ORAL | Status: DC

## 2011-04-03 MED ORDER — AMLODIPINE BESYLATE 10 MG PO TABS: 10.0000 mg | ORAL_TABLET | Freq: Every day | ORAL | Status: DC

## 2011-04-03 MED ORDER — BENAZEPRIL HCL 40 MG PO TABS: 40.0000 mg | ORAL_TABLET | Freq: Every day | ORAL | Status: DC

## 2011-04-03 MED ORDER — HYDROCHLOROTHIAZIDE 25 MG PO TABS: 12.5000 mg | ORAL_TABLET | Freq: Every day | ORAL | Status: DC

## 2011-04-03 NOTE — Progress Notes (Signed)
  Subjective:    Patient ID: Lori Trujillo, female    DOB: 1921-08-02, 78 y.o.   MRN: [id number]  HPI  ROV, does not desire a yearly check up  Today she reports that for the last 2 months she has been noting dyspnea on exertion when she walks about a block. Previously she was able to walk that far without any symptoms. Also developed a cold just in the last 24 hours with some cough, mild chest congestion. Occasional wheezing.   Past Medical History:  Hyperlipidemia  Hypertension  hypothyroidism, status post treatment with iodine 06/2009  tachy brady syndrome per holter 2003, s/p pacemaker implantation 2003 in [place]--- pacemaker replaced 11-2009  h/o HYPERCALCEMIA  2000 ?TIA had a Carotid u/s 1-15% and a brain MRI lacunar dz  2002: chronic neck pain MRI Cspine: DJD  2002: ECHO diast. disfx and (-) stres ECHO  h/o diastolic dysfunction that later on improved   Past Surgical History:  Hysterectomy  Tonsillectomy    Social History:  lives by self, independent on her ADL , sometimes has a difficult time getting in and out of the shower  not driving anymore  widow  son died from a MI Jun 08, 2022  son and a daughter in [place]  still does tai-chi  tobacco-- quit ~2000, used to smoke 1/4 ppd x years     Review of Systems Denies lower extremity edema or palpitations. Despite DOE she remains very active. She does calisthenics and taichi On further questioning, she admits to chest pain. The chest pain is  sporadic, exertional, not consistent (not every time she exerts) there is no radiation or associated symptoms. The pain is stopped after burping and stopping exercising. Has not experienced the CP and DOE at the same time      Objective:   Physical Exam Alert oriented x3, no apparent distress, speaking complete sentences. O2 sat 94-96% Neck no JVD. Lungs fine crackles bilaterally, at least high way out. Few rhonchi, some end expiratory wheezing Cardiovascular, she has a systolic murmur  previously noted. Extremities with trace lower extremity edema.       Assessment & Plan:  Presents today with the following issues: --Chest pain, often times exertional and decreased by burp or stopping exercise --subtle dyspnea on exertion for 2 months. --Respiratory symptoms for one day. --hypothyroidism f/u EKG she has some T-wave inversions, and they are not new, slightly more pronounced   today. I am somehow concerned about a chest pain and dyspnea on exertion although this is gone now for a couple of months plan: CXR  BMP CBC  in a.m.,  is already after 5 PM  discussed EKG with cardiology in a.m. ER if symptoms severe. As far as they respiratory symptoms for one day, will do a chest x-ray, further treatment based on results.   Addendum: Chest x-ray showed   pneumonia, called in  Avelox 400 mg 1 by mouth daily for one week. I called the patient's son, spoke with his wife. Aware that a prescription has been called in. Also recommend ER if symptoms get worse overnight. JP  Addendum: EKG d/w cards, concur that TWI more prominent , could be LVH or CAD, depending on pt status may need a Cath or myoview. I'll discuss w/pt and family  JP

## 2011-04-03 NOTE — Patient Instructions (Signed)
Please get  A chest x-ray Come back in the morning for labs, no need to be fasting: TSH-- hypothyroidism BMP, CBC-- chest pain ER if severe chest pain or difficulty breathing.

## 2011-04-04 ENCOUNTER — Encounter: Payer: Self-pay | Admitting: Internal Medicine

## 2011-04-04 ENCOUNTER — Other Ambulatory Visit: Payer: Medicare Other

## 2011-04-04 DIAGNOSIS — E039 Hypothyroidism, unspecified: Secondary | ICD-10-CM

## 2011-04-04 DIAGNOSIS — N390 Urinary tract infection, site not specified: Secondary | ICD-10-CM

## 2011-04-04 LAB — BASIC METABOLIC PANEL
CO2: 29 mEq/L (ref 19–32)
Calcium: 10 mg/dL (ref 8.4–10.5)
Chloride: 99 mEq/L (ref 96–112)
Creatinine, Ser: 1 mg/dL (ref 0.4–1.2)
Glucose, Bld: 138 mg/dL — ABNORMAL HIGH (ref 70–99)

## 2011-04-04 LAB — CBC WITH DIFFERENTIAL/PLATELET
Basophils Relative: 0.3 % (ref 0.0–3.0)
Eosinophils Absolute: 0 10*3/uL (ref 0.0–0.7)
Lymphocytes Relative: 13.5 % (ref 12.0–46.0)
MCHC: 33 g/dL (ref 30.0–36.0)
MCV: 92.5 fl (ref 78.0–100.0)
Monocytes Absolute: 0.7 10*3/uL (ref 0.1–1.0)
Neutro Abs: 5.4 10*3/uL (ref 1.4–7.7)
Neutrophils Relative %: 76.5 % (ref 43.0–77.0)
RBC: 3.96 Mil/uL (ref 3.87–5.11)
RDW: 15.1 % — ABNORMAL HIGH (ref 11.5–14.6)

## 2011-04-05 ENCOUNTER — Telehealth: Payer: Self-pay | Admitting: Internal Medicine

## 2011-04-05 NOTE — Telephone Encounter (Signed)
Thank you :)

## 2011-04-05 NOTE — Telephone Encounter (Signed)
Tried calling patient several times. Line is busy. Will try again Monday.

## 2011-04-05 NOTE — Telephone Encounter (Signed)
Called pt , states she is doing slt better, denies F, CP SOB.good compliance w/ abx   [place], please call her later on today and schedule an appointment next week

## 2011-04-08 NOTE — Telephone Encounter (Signed)
Pt is scheduled for Wednesday 3-20.

## 2011-04-10 ENCOUNTER — Ambulatory Visit (INDEPENDENT_AMBULATORY_CARE_PROVIDER_SITE_OTHER): Payer: Medicare Other | Admitting: Internal Medicine

## 2011-04-10 VITALS — BP 122/64 | HR 71 | Temp 98.1°F | Wt 99.0 lb

## 2011-04-10 DIAGNOSIS — R079 Chest pain, unspecified: Secondary | ICD-10-CM

## 2011-04-10 DIAGNOSIS — I200 Unstable angina: Secondary | ICD-10-CM | POA: Insufficient documentation

## 2011-04-10 DIAGNOSIS — J189 Pneumonia, unspecified organism: Secondary | ICD-10-CM | POA: Insufficient documentation

## 2011-04-10 LAB — CULTURE, URINE COMPREHENSIVE
Colony Count: NO GROWTH
Organism ID, Bacteria: NO GROWTH

## 2011-04-10 MED ORDER — NITROGLYCERIN 0.4 MG SL SUBL: 0.4000 mg | SUBLINGUAL_TABLET | SUBLINGUAL | Status: DC | PRN

## 2011-04-10 NOTE — Progress Notes (Signed)
  Subjective:    Patient ID: Lori Trujillo, female    DOB: [birth year], 78 y.o.   MRN: [id number]  HPI Followup from last visit. She was diagnosed with pneumonia, good compliance with Avelox. She was also complaining of chest pain shortness of breath, she continued to have the episodes as previously described.  Past Medical History:  Hyperlipidemia  Hypertension  hypothyroidism, status post treatment with iodine 06/2009  tachy brady syndrome per holter 2003, s/p pacemaker implantation 2003 in [place]--- pacemaker replaced 11-2009  h/o HYPERCALCEMIA  2000 ?TIA had a Carotid u/s 1-15% and a brain MRI lacunar dz  2002: chronic neck pain MRI Cspine: DJD  2002: ECHO diast. disfx and (-) stres ECHO  h/o diastolic dysfunction that later on improved  Past Surgical History:  Hysterectomy  Tonsillectomy  Social History:  lives by self, independent on her ADL , sometimes has a difficult time getting in and out of the shower  not driving anymore  widow  son died from a MI Jun 19, 2022  son and a daughter in [place]  still does tai-chi  tobacco-- quit ~2000, used to smoke 1/4 ppd x years    Review of Systems In general feels slightly better, still is coughing  up whitish sputum. Denies hemoptysis. No rusty sputum. No fever or chills. She does report occasional dizziness. She still has some urinary symptoms.     Objective:   Physical Exam Alert oriented x3, no apparent distress, vital signs stable. Lungs, fine crackles in the bases, no other abnormalities. Extremities, no edema.      Assessment & Plan:

## 2011-04-10 NOTE — Patient Instructions (Signed)
Rest, take lots of fluids and Robitussin-DM for cough. If you have increased cough, fever or feeling poorly now. In one month, go up to the other [facility] office @ [facility] and get a repeated chest x-ray  to follow up the pneumonia. --------------------------------------------------- As far as the pain in the chest, use nitroglycerin  if the pain lasts more than minutes. If the pain episodes are getting more frequent and intense,  let me know. If the pain is severe go to the ER.

## 2011-04-11 ENCOUNTER — Encounter: Payer: Self-pay | Admitting: Internal Medicine

## 2011-04-11 NOTE — Assessment & Plan Note (Signed)
Continue w/ on-off CP, last EKG slt different than baseline. Although she is a poor invasive therapy candidate, i like her to discuss sx w/ cardiology, see instructions

## 2011-04-11 NOTE — Assessment & Plan Note (Signed)
See instructions, CXR in 1 month

## 2011-04-12 ENCOUNTER — Ambulatory Visit (INDEPENDENT_AMBULATORY_CARE_PROVIDER_SITE_OTHER): Payer: Medicare Other | Admitting: Nurse Practitioner

## 2011-04-12 ENCOUNTER — Telehealth: Payer: Self-pay | Admitting: Internal Medicine

## 2011-04-12 ENCOUNTER — Encounter: Payer: Self-pay | Admitting: Nurse Practitioner

## 2011-04-12 DIAGNOSIS — R079 Chest pain, unspecified: Secondary | ICD-10-CM

## 2011-04-12 DIAGNOSIS — I10 Essential (primary) hypertension: Secondary | ICD-10-CM

## 2011-04-12 MED ORDER — ISOSORBIDE MONONITRATE ER 30 MG PO TB24: 30.0000 mg | ORAL_TABLET | Freq: Every day | ORAL | Status: DC

## 2011-04-12 NOTE — Progress Notes (Signed)
Nadara Mode Date of Birth: Jan 04, 1922 Medical Record #[id number]  History of Present Illness: Ms. Lori Trujillo is seen today for a work in visit. She is seen for Dr. Graciela Husbands. She is an 78 year old female with a history of SSS and has a pacemaker in place. Her other problems include HTN, HLD, hypothyroidism, TIA, hypercalcemia, chronic neck pain, DJD and diastolic dysfunction. Her last echo was in 2011. Her EF is normal. She does not have any known CAD.  She comes in today. She is here with her son, Rolm Gala. She notes that she has had a dull chest discomfort that is brought on with exertion and relieved with rest and burping since September. It is not getting worse, but not getting any better. She does have some associated shortness of breath. She does try to stay active. She lives in a retirement facility. She is still driving. She does yoga and tai chi. She still does her own housework. She has recently been found to have pneumonia. She says this is getting better and that the chest pain started back in September. While she was seeing Dr. Drue Novel for her pneumonia, she told him about her chest pain and hence she is here. She also notes that prior to the pneumonia, she has had a dry aggravating cough that won't go away and she thinks it is due to her medicines. She has no chest pain at rest. Her EKG at Dr. Leta Jungling does show more T wave changes.   Current Outpatient Prescriptions on File Prior to Visit  Medication Sig Dispense Refill  . amLODipine (NORVASC) 10 MG tablet Take 1 tablet (10 mg total) by mouth daily.  30 tablet  6  . aspirin EC 325 MG tablet Take 325 mg by mouth daily.        . benazepril (LOTENSIN) 40 MG tablet Take 1 tablet (40 mg total) by mouth daily.  30 tablet  6  . cholecalciferol (VITAMIN D) 1000 UNITS tablet Take 1,000 Units by mouth daily.        Marland Kitchen docusate sodium (COLACE) 100 MG capsule Take by mouth as needed.       . hydrochlorothiazide (HYDRODIURIL) 25 MG tablet Take 0.5 tablets (12.5  mg total) by mouth daily.  30 tablet  6  . levothyroxine (SYNTHROID, LEVOTHROID) 100 MCG tablet TAKE ONE TABLET BY MOUTH EVERY DAY  30 tablet  6  . nitroGLYCERIN (NITROSTAT) 0.4 MG SL tablet Place 1 tablet (0.4 mg total) under the tongue every 5 (five) minutes as needed for chest pain (no more than 3 tablets a day).  20 tablet  1  . zoster vaccine live, PF, (ZOSTAVAX) 24401 UNT/0.65ML injection Inject 0.65 mLs into the skin once.          Allergies  Allergen Reactions  . Simvastatin     Past Medical History  Diagnosis Date  . Hyperlipidemia   . Hypertension   . Hypothyroidism 6/11    Thyroid ablation, s/p treatment with iodine  . Tachy-brady syndrome 2003    Per holter, s/p pacemaker implantation in [place] (pacemaker replaced 11/11)  . Hypercalcemia   . TIA (transient ischemic attack) 2000    ?TIA; carotid u/s 1-15% and brain MRI lacunar dz  . Chronic neck pain 2002  . DJD (degenerative joint disease) of cervical spine 2002    MRI Cspine  . Echocardiogram shows left ventricular diastolic dysfunction 2002    And (-) stress ECHO (diastolic dysfunction later improved)  . Pneumonia March 2013  Past Surgical History  Procedure Date  . Abdominal hysterectomy   . Tonsillectomy     History  Smoking status  . Former Smoker -- 0.2 packs/day  . Quit date: 01/21/1973  Smokeless tobacco  . Not on file    History  Alcohol Use: Not on file    Family History  Problem Relation Age of Onset  . Cancer Neg Hx     No breast or colon  . Goiter Neg Hx     No other thyroid problems  . Heart attack Son     Review of Systems: The review of systems is positive for frequent urination.  All other systems were reviewed and are negative.  Physical Exam: BP 132/60  Pulse 74  Ht 5\' 3"  (1.6 m)  Wt 97 lb 9.6 oz (44.271 kg)  BMI 17.29 kg/m2 Patient is very pleasant elderly female who is in no acute distress. Skin is warm and dry. Color is normal.  HEENT is unremarkable.  Normocephalic/atraumatic. PERRL. Sclera are nonicteric. Neck is supple. No masses. No JVD. Lungs show some crackles in the bases. Cardiac exam shows a regular rate and rhythm. Abdomen is soft. Extremities are without edema. Gait and ROM are intact. No gross neurologic deficits noted.  EKG is reviewed with Dr. Graciela Husbands and does show more T wave inversion anterolaterally.   Lab Results  Component Value Date   WBC 7.1 04/04/2011   HGB 12.1 04/04/2011   HCT 36.7 04/04/2011   PLT 237.0 04/04/2011   GLUCOSE 138* 04/04/2011   CHOL 286* 10/01/2010   TRIG 48.0 10/01/2010   HDL 134.60 10/01/2010   LDLDIRECT 138.4 10/01/2010   ALT 8 12/29/2008   AST 18 12/29/2008   NA 138 04/04/2011   K 3.6 04/04/2011   CL 99 04/04/2011   CREATININE 1.0 04/04/2011   BUN 15 04/04/2011   CO2 29 04/04/2011   TSH 0.60 04/04/2011   Dg Chest 2 View  04/03/2011  *RADIOLOGY REPORT*  Clinical Data: Cough  CHEST - 2 VIEW  Comparison: 08/19/2008  Findings: Heart is moderately enlarged.  Vascular congestion is present.  The right hilum has become prominent in this may simply be due to perihilar airspace disease.  Increased patchy density extending from the right hilum towards the right base is noted. Linear atelectasis or scar at the left base.  Dual lead left subclavian pacemaker device and leads are stable.  No pneumothorax. Stable thoracic spine.  IMPRESSION: Interval development of right perihilar and basilar pulmonary opacity worrisome for airspace disease.  Follow-up radiographs after appropriate antibiotic therapy until resolution are recommended.  Cardiomegaly and vascular congestion without definite edema.  Original Report Authenticated By: Donavan Burnet, M.D.    Assessment / Plan:

## 2011-04-12 NOTE — Telephone Encounter (Signed)
Please return call to patient at hM# [phone]  Patient would like to speak with nurse concerning med instructions given at appnt with Dawayne Patricia. Today.  She can be reached at hM# [phone]

## 2011-04-12 NOTE — Assessment & Plan Note (Signed)
Blood pressure looks ok. She reports long standing cough prior to this bout of pneumonia. I have stopped her ACE. Will give her a trial of Benicar 40 mg. Samples are provided.

## 2011-04-12 NOTE — Patient Instructions (Signed)
We are going to arrange for a stress test  Stop your Lisinopril - I think this is making you cough  We are going to put you on Benicar 40 mg a day (this is in the place of the Lisinopril)  We are going to start you on Imdur 30 mg daily. This is to help prevent chest pain. It may cause a headache.  We will see you back in about 3 weeks to discuss further.  Call the [facility] office at [phone] if you have any questions, problems or concerns.

## 2011-04-12 NOTE — Telephone Encounter (Signed)
I spoke with the patient. She states her instructions told her to stop lisinopril.  She states she is not on that and wondered if Lawson Fiscal meant benazepril. I explained she is correct. She should stop benazepril as this could contribute to her cough. She voices understanding.

## 2011-04-12 NOTE — Assessment & Plan Note (Signed)
Patient presents with a 6 month history of exertional chest pain relieved with rest/burping. EKG is more worrisome. I have discussed her care with Dr. Graciela Husbands. We are adding Imdur 30 mg daily. Sl NTG was given by Dr. Drue Novel. We will refer her for a stress test. She will see Dr. Graciela Husbands back in about 3 weeks. Hopefully we can manage her conservatively, but she is a fairly active 78 year old. Patient and her son are agreeable to this plan and will call if any problems develop in the interim.

## 2011-04-22 HISTORY — PX: CARDIAC CATHETERIZATION: SHX172

## 2011-04-24 ENCOUNTER — Ambulatory Visit (HOSPITAL_COMMUNITY): Payer: Medicare Other | Attending: Cardiology | Admitting: Radiology

## 2011-04-24 DIAGNOSIS — Z87891 Personal history of nicotine dependence: Secondary | ICD-10-CM | POA: Insufficient documentation

## 2011-04-24 DIAGNOSIS — E785 Hyperlipidemia, unspecified: Secondary | ICD-10-CM | POA: Insufficient documentation

## 2011-04-24 DIAGNOSIS — R079 Chest pain, unspecified: Secondary | ICD-10-CM

## 2011-04-24 DIAGNOSIS — J449 Chronic obstructive pulmonary disease, unspecified: Secondary | ICD-10-CM | POA: Insufficient documentation

## 2011-04-24 DIAGNOSIS — R0602 Shortness of breath: Secondary | ICD-10-CM

## 2011-04-24 DIAGNOSIS — I10 Essential (primary) hypertension: Secondary | ICD-10-CM | POA: Insufficient documentation

## 2011-04-24 DIAGNOSIS — Z8673 Personal history of transient ischemic attack (TIA), and cerebral infarction without residual deficits: Secondary | ICD-10-CM | POA: Insufficient documentation

## 2011-04-24 DIAGNOSIS — J4489 Other specified chronic obstructive pulmonary disease: Secondary | ICD-10-CM | POA: Insufficient documentation

## 2011-04-24 DIAGNOSIS — Z8249 Family history of ischemic heart disease and other diseases of the circulatory system: Secondary | ICD-10-CM | POA: Insufficient documentation

## 2011-04-24 MED ORDER — REGADENOSON 0.4 MG/5ML IV SOLN
0.4000 mg | Freq: Once | INTRAVENOUS | Status: AC
  Administered 2011-04-24: 0.4 mg via INTRAVENOUS

## 2011-04-24 MED ORDER — TECHNETIUM TC 99M TETROFOSMIN IV KIT
33.0000 | PACK | Freq: Once | INTRAVENOUS | Status: AC | PRN
  Administered 2011-04-24: 33 via INTRAVENOUS

## 2011-04-24 MED ORDER — TECHNETIUM TC 99M TETROFOSMIN IV KIT
11.0000 | PACK | Freq: Once | INTRAVENOUS | Status: AC | PRN
  Administered 2011-04-24: 11 via INTRAVENOUS

## 2011-04-24 NOTE — Progress Notes (Addendum)
[facility] SITE 3 NUCLEAR MED [address] [place] [address] [phone]  Cardiology Nuclear Med Study  Lori Trujillo is a 78 y.o. female     MRN : [id number]     DOB: [birth year]  Procedure Date: 04/24/2011  Nuclear Med Background Indication for Stress Test:  Evaluation for Ischemia History:  COPD Cardiac Risk Factors: Family History - CAD, History of Smoking, Hypertension, Lipids and TIA  Symptoms:  Chest Pain   Nuclear Pre-Procedure Caffeine/Decaff Intake:  None NPO After: 6:00pm   Lungs:  clear O2 Sat: 98% on room air. IV 0.9% NS with Angio Cath:  22g  IV Site: R Antecubital  IV Started by:  Stanton Kidney, EMT-P  Chest Size (in):  32 Cup Size: B  Height: 5\' 1"  (1.549 m)  Weight:  108 lb (48.988 kg)  BMI:  Body mass index is 20.41 kg/(m^2). Tech Comments:  NA    Nuclear Med Study 1 or 2 day study: 1 day  Stress Test Type:  Lexiscan  Reading MD: Marca Ancona, MD  Order Authorizing Provider:  Wall  Resting Radionuclide: Technetium 9m Tetrofosmin  Resting Radionuclide Dose: 11.0 mCi   Stress Radionuclide:  Technetium 60m Tetrofosmin  Stress Radionuclide Dose: 32.9 mCi           Stress Protocol Rest HR:  Stress HR:   Rest BP:  Stress BP:   Exercise Time (min): n/a METS: n/a          Dose of Adenosine (mg):  n/a Dose of Lexiscan: 0.4 mg  Dose of Atropine (mg): n/a Dose of Dobutamine: n/a mcg/kg/min (at max HR)  Stress Test Technologist: Cathlyn Parsons, RN  Nuclear Technologist:  Doyne Keel, CNMT     Rest Procedure:  Myocardial perfusion imaging was performed at rest 45 minutes following the intravenous administration of Technetium 70m Tetrofosmin. Rest ECG: Old ASMI with inferior and lateral TWIs.   Stress Procedure:  The patient received IV Lexiscan 0.4 mg over 15-seconds.  Technetium 71m Tetrofosmin injected at 30-seconds.  There were no significant changes with Lexiscan.  Quantitative spect images were obtained after a 45 minute  delay. Stress ECG: No significant change from baseline ECG  QPS Raw Data Images:  Normal; no motion artifact; normal heart/lung ratio. Stress Images:  Large, severe defect involving the entire anterior and anteroseptal walls.  Rest Images:  Same as stress.  Subtraction (SDS):  Large, severe, fixed perfusion defect involving the entire anterior and anteroseptal walls.  Transient Ischemic Dilatation (Normal <1.22):  1.05 Lung/Heart Ratio (Normal <0.45):  0.36  Quantitative Gated Spect Images QGS EDV:  141 ml QGS ESV:  97 ml  Impression Exercise Capacity:  Lexiscan with no exercise. BP Response:  Hypotensive blood pressure response. Clinical Symptoms:  Felt "strange" ECG Impression:  Baseline old ASMI, inferior and lateral TWIs.  No change with infusion.  Comparison with Prior Nuclear Study: No images to compare  Overall Impression:  Abnormal stress nuclear study.  Large anterior and anteroseptal fixed defect suggestive of prior MI with no significant ischemia.   LV Ejection Fraction: 31%.  LV Wall Motion:  Anterior and anteroseptal severe hypokinesis.   Lori Trujillo [employer]

## 2011-04-25 NOTE — Progress Notes (Signed)
Resting HR:62; Resting BP:132/65 Dr. Berton Mount ordered [facility] and test was performed by Smiley Houseman, CMA-N

## 2011-05-03 ENCOUNTER — Encounter (HOSPITAL_COMMUNITY): Payer: Self-pay

## 2011-05-03 ENCOUNTER — Encounter: Payer: Self-pay | Admitting: Internal Medicine

## 2011-05-03 ENCOUNTER — Ambulatory Visit (INDEPENDENT_AMBULATORY_CARE_PROVIDER_SITE_OTHER): Payer: Medicare Other | Admitting: Internal Medicine

## 2011-05-03 ENCOUNTER — Inpatient Hospital Stay (HOSPITAL_COMMUNITY)
Admission: AD | Admit: 2011-05-03 | Discharge: 2011-05-10 | DRG: 281 | Disposition: A | Discharge status: Home / Self Care | Payer: Medicare Other | Source: Ambulatory Visit | Attending: Internal Medicine | Admitting: Internal Medicine

## 2011-05-03 VITALS — BP 114/52 | HR 75 | Ht 62.0 in | Wt 103.0 lb

## 2011-05-03 DIAGNOSIS — I214 Non-ST elevation (NSTEMI) myocardial infarction: Secondary | ICD-10-CM

## 2011-05-03 DIAGNOSIS — I495 Sick sinus syndrome: Secondary | ICD-10-CM | POA: Insufficient documentation

## 2011-05-03 DIAGNOSIS — Z950 Presence of cardiac pacemaker: Secondary | ICD-10-CM

## 2011-05-03 DIAGNOSIS — I519 Heart disease, unspecified: Secondary | ICD-10-CM

## 2011-05-03 DIAGNOSIS — I10 Essential (primary) hypertension: Secondary | ICD-10-CM | POA: Diagnosis present

## 2011-05-03 DIAGNOSIS — R9430 Abnormal result of cardiovascular function study, unspecified: Secondary | ICD-10-CM

## 2011-05-03 DIAGNOSIS — Z8673 Personal history of transient ischemic attack (TIA), and cerebral infarction without residual deficits: Secondary | ICD-10-CM

## 2011-05-03 DIAGNOSIS — E785 Hyperlipidemia, unspecified: Secondary | ICD-10-CM | POA: Diagnosis present

## 2011-05-03 DIAGNOSIS — I5022 Chronic systolic (congestive) heart failure: Secondary | ICD-10-CM | POA: Diagnosis present

## 2011-05-03 DIAGNOSIS — R079 Chest pain, unspecified: Secondary | ICD-10-CM

## 2011-05-03 DIAGNOSIS — E890 Postprocedural hypothyroidism: Secondary | ICD-10-CM | POA: Diagnosis present

## 2011-05-03 DIAGNOSIS — R3915 Urgency of urination: Secondary | ICD-10-CM | POA: Diagnosis not present

## 2011-05-03 DIAGNOSIS — I2589 Other forms of chronic ischemic heart disease: Secondary | ICD-10-CM | POA: Diagnosis present

## 2011-05-03 DIAGNOSIS — I2510 Atherosclerotic heart disease of native coronary artery without angina pectoris: Secondary | ICD-10-CM | POA: Diagnosis present

## 2011-05-03 LAB — COMPREHENSIVE METABOLIC PANEL
Alkaline Phosphatase: 76 U/L (ref 39–117)
BUN: 17 mg/dL (ref 6–23)
Creatinine, Ser: 0.69 mg/dL (ref 0.50–1.10)
GFR calc Af Amer: 88 mL/min — ABNORMAL LOW (ref >90)
Glucose, Bld: 94 mg/dL (ref 70–99)
Potassium: 3.6 mEq/L (ref 3.5–5.1)
Total Bilirubin: 0.5 mg/dL (ref 0.3–1.2)
Total Protein: 6.8 g/dL (ref 6.0–8.3)

## 2011-05-03 LAB — PACEMAKER DEVICE OBSERVATION
AL AMPLITUDE: 2 mv
AL IMPEDENCE PM: 421 Ohm
BATTERY VOLTAGE: 2.79 V
RV LEAD AMPLITUDE: 31.36 mv
RV LEAD IMPEDENCE PM: 633 Ohm
VENTRICULAR PACING PM: 0

## 2011-05-03 LAB — CBC
HCT: 34.5 % — ABNORMAL LOW (ref 36.0–46.0)
Hemoglobin: 11.9 g/dL — ABNORMAL LOW (ref 12.0–15.0)
MCH: 30.5 pg (ref 26.0–34.0)
MCHC: 34.5 g/dL (ref 30.0–36.0)
MCV: 88.5 fL (ref 78.0–100.0)

## 2011-05-03 LAB — DIFFERENTIAL
Basophils Relative: 0 % (ref 0–1)
Eosinophils Relative: 0 % (ref 0–5)
Monocytes Absolute: 0.7 10*3/uL (ref 0.1–1.0)
Monocytes Relative: 9 % (ref 3–12)
Neutro Abs: 6.1 10*3/uL (ref 1.7–7.7)

## 2011-05-03 LAB — CARDIAC PANEL(CRET KIN+CKTOT+MB+TROPI): Total CK: 345 U/L — ABNORMAL HIGH (ref 7–177)

## 2011-05-03 LAB — MAGNESIUM: Magnesium: 2 mg/dL (ref 1.5–2.5)

## 2011-05-03 LAB — APTT: aPTT: 35 seconds (ref 24–37)

## 2011-05-03 MED ORDER — HYDROCHLOROTHIAZIDE 25 MG PO TABS
12.5000 mg | ORAL_TABLET | Freq: Every day | ORAL | Status: DC
  Administered 2011-05-04: 12.5 mg via ORAL
  Filled 2011-05-03: qty 0.5

## 2011-05-03 MED ORDER — ISOSORBIDE MONONITRATE ER 30 MG PO TB24
30.0000 mg | ORAL_TABLET | Freq: Every day | ORAL | Status: DC
  Administered 2011-05-04 – 2011-05-07 (×4): 30 mg via ORAL
  Filled 2011-05-03 (×5): qty 1

## 2011-05-03 MED ORDER — ONDANSETRON HCL 4 MG/2ML IJ SOLN: 4.0000 mg | Freq: Four times a day (QID) | INTRAMUSCULAR | Status: DC | PRN

## 2011-05-03 MED ORDER — ACETAMINOPHEN 325 MG PO TABS
650.0000 mg | ORAL_TABLET | ORAL | Status: DC | PRN
  Administered 2011-05-06 (×3): 650 mg via ORAL
  Filled 2011-05-03 (×3): qty 2

## 2011-05-03 MED ORDER — ASPIRIN EC 325 MG PO TBEC
325.0000 mg | DELAYED_RELEASE_TABLET | Freq: Every day | ORAL | Status: DC
  Administered 2011-05-04 – 2011-05-05 (×2): 325 mg via ORAL
  Filled 2011-05-03 (×2): qty 1

## 2011-05-03 MED ORDER — LEVOTHYROXINE SODIUM 100 MCG PO TABS
100.0000 ug | ORAL_TABLET | Freq: Every day | ORAL | Status: DC
  Administered 2011-05-04 – 2011-05-10 (×7): 100 ug via ORAL
  Filled 2011-05-03 (×8): qty 1

## 2011-05-03 MED ORDER — NITROGLYCERIN 0.4 MG SL SUBL: 0.4000 mg | SUBLINGUAL_TABLET | SUBLINGUAL | Status: DC | PRN

## 2011-05-03 MED ORDER — VITAMIN D3 25 MCG (1000 UNIT) PO TABS
1000.0000 [IU] | ORAL_TABLET | Freq: Every day | ORAL | Status: DC
  Administered 2011-05-04 – 2011-05-10 (×7): 1000 [IU] via ORAL
  Filled 2011-05-03 (×7): qty 1

## 2011-05-03 MED ORDER — HEPARIN BOLUS VIA INFUSION
2500.0000 [IU] | Freq: Once | INTRAVENOUS | Status: AC
  Administered 2011-05-03: 2500 [IU] via INTRAVENOUS
  Filled 2011-05-03: qty 2500

## 2011-05-03 MED ORDER — DOCUSATE SODIUM 100 MG PO CAPS
100.0000 mg | ORAL_CAPSULE | Freq: Every day | ORAL | Status: DC | PRN
  Filled 2011-05-03: qty 1

## 2011-05-03 MED ORDER — AMLODIPINE BESYLATE 10 MG PO TABS
10.0000 mg | ORAL_TABLET | Freq: Every day | ORAL | Status: DC
  Administered 2011-05-04 – 2011-05-07 (×4): 10 mg via ORAL
  Filled 2011-05-03 (×5): qty 1

## 2011-05-03 MED ORDER — HEPARIN (PORCINE) IN NACL 100-0.45 UNIT/ML-% IJ SOLN
650.0000 [IU]/h | INTRAMUSCULAR | Status: DC
  Administered 2011-05-03: 550 [IU]/h via INTRAVENOUS
  Filled 2011-05-03: qty 250

## 2011-05-03 MED ORDER — SODIUM CHLORIDE 0.9 % IV SOLN: INTRAVENOUS | Status: DC

## 2011-05-03 MED ORDER — METOPROLOL TARTRATE 12.5 MG HALF TABLET
12.5000 mg | ORAL_TABLET | Freq: Two times a day (BID) | ORAL | Status: DC
  Administered 2011-05-03 – 2011-05-04 (×2): 12.5 mg via ORAL
  Filled 2011-05-03 (×4): qty 1

## 2011-05-03 NOTE — Progress Notes (Signed)
CRITICAL VALUE ALERT  Critical value received:  CKMB- 20.4,  Troponin-14.35  Date of notification:  4/12  Time of notification:  1900  Critical value read back: yes  Nurse who received alert: Lenna Gilford  MD notified (1st page):  1900  Time of first page:  1900  MD notified (2nd page):   Time of second page:  Responding MD: Ward Givens, NP  Time MD responded: [phone]

## 2011-05-03 NOTE — Progress Notes (Signed)
ANTICOAGULATION CONSULT NOTE - Initial Consult  Pharmacy Consult for heparin Indication: chest pain/ACS (abnormal myoview)  Allergies  Allergen Reactions  . Simvastatin     unknown    Patient Measurements:  Pt reported Ht = 62" Pt reported Wt = 103 lbs Heparin Dosing Weight: 46.8 kg  Vital Signs: Temp: 98.8 F (37.1 C) (04/12 1710) Temp src: Oral (04/12 1710) BP: 149/72 mmHg (04/12 1710) Pulse Rate: 69  (04/12 1710)  Labs: No results found for this basename: HGB:2,HCT:3,PLT:3,APTT:3,LABPROT:3,INR:3,HEPARINUNFRC:3,CREATININE:3,CKTOTAL:3,CKMB:3,TROPONINI:3 in the last 72 hours The CrCl is unknown because both a height and weight (above a minimum accepted value) are required for this calculation.  Medical History: Past Medical History  Diagnosis Date  . Hyperlipidemia   . Hypertension   . Hypothyroidism 6/11    Thyroid ablation, s/p treatment with iodine  . Tachy-brady syndrome 2003    Per holter, s/p pacemaker implantation in [place] (pacemaker replaced 11/11)  . Hypercalcemia   . TIA (transient ischemic attack) 2000    ?TIA; carotid u/s 1-15% and brain MRI lacunar dz  . Chronic neck pain 2002  . DJD (degenerative joint disease) of cervical spine 2002    MRI Cspine  . Echocardiogram shows left ventricular diastolic dysfunction 2002    And (-) stress ECHO (diastolic dysfunction later improved)  . Pneumonia March 2013    Medications:  Scheduled:    . amLODipine  10 mg Oral Daily  . aspirin EC  325 mg Oral Daily  . cholecalciferol  1,000 Units Oral Daily  . hydrochlorothiazide  12.5 mg Oral Daily  . isosorbide mononitrate  30 mg Oral Daily  . levothyroxine  100 mcg Oral QAC breakfast  . metoprolol tartrate  12.5 mg Oral BID    Assessment: 78 yo female admitted after markedly abnormal myoview.  To begin IV heparin while awaiting cath on Monday.  Today's labs are in process, 04/04/11 CBC WNL, H/H 12.1/36.7; Pltc 237; Scr 1  Goal of Therapy:  Heparin level 0.3-0.7  units/ml   Plan:  1. Start IV heparin with bolus of 2500 units x 1. 2. Then start heparin gtt at 550 units/hr. 3. Check heparin level in 8 hrs, then check daily heparin level and CBC.  Gardner Candle 05/03/2011,7:23 PM

## 2011-05-03 NOTE — Progress Notes (Signed)
HPI  Lori Trujillo is a 78 y.o. female Seen following a markedly abnormal Myoview scan.  She was seen by LG2 weeks ago as a work in for chest pain. She has had some problems with exertional dyspnea as well new and worsening peripheral edema and an epigastric discomfort that was provoked by exertion also. Her previous assessment of left ventricular function with an echo in 2011 LV function was normal. When she was seen 2 weeks ago she was noted to have T-wave inversions anterolaterally that were new. She was admitted for Myoview scanning that demonstrates a dense anterior infarct without significant ischemia. However, she continues to have exertional chest discomfort and dyspnea on exertion even today.  Past Medical History  Diagnosis Date  . Hyperlipidemia   . Hypertension   . Hypothyroidism 6/11    Thyroid ablation, s/p treatment with iodine  . Tachy-brady syndrome 2003    Per holter, s/p pacemaker implantation in [place] (pacemaker replaced 11/11)  . Hypercalcemia   . TIA (transient ischemic attack) 2000    ?TIA; carotid u/s 1-15% and brain MRI lacunar dz  . Chronic neck pain 2002  . DJD (degenerative joint disease) of cervical spine 2002    MRI Cspine  . Echocardiogram shows left ventricular diastolic dysfunction 2002    And (-) stress ECHO (diastolic dysfunction later improved)  . Pneumonia March 2013    Past Surgical History  Procedure Date  . Abdominal hysterectomy   . Tonsillectomy     Current Outpatient Prescriptions  Medication Sig Dispense Refill  . amLODipine (NORVASC) 10 MG tablet Take 1 tablet (10 mg total) by mouth daily.  30 tablet  6  . aspirin EC 325 MG tablet Take 325 mg by mouth daily.        . cholecalciferol (VITAMIN D) 1000 UNITS tablet Take 1,000 Units by mouth daily.        Marland Kitchen docusate sodium (COLACE) 100 MG capsule Take by mouth as needed.       . hydrochlorothiazide (HYDRODIURIL) 25 MG tablet Take 0.5 tablets (12.5 mg total) by mouth daily.  30 tablet  6   . isosorbide mononitrate (IMDUR) 30 MG 24 hr tablet Take 1 tablet (30 mg total) by mouth daily.  30 tablet  11  . levothyroxine (SYNTHROID, LEVOTHROID) 100 MCG tablet TAKE ONE TABLET BY MOUTH EVERY DAY  30 tablet  6  . nitroGLYCERIN (NITROSTAT) 0.4 MG SL tablet Place 1 tablet (0.4 mg total) under the tongue every 5 (five) minutes as needed for chest pain (no more than 3 tablets a day).  20 tablet  1  . zoster vaccine live, PF, (ZOSTAVAX) 16109 UNT/0.65ML injection Inject 0.65 mLs into the skin once.        Marland Kitchen DISCONTD: benazepril (LOTENSIN) 40 MG tablet Take 1 tablet (40 mg total) by mouth daily.  30 tablet  6    Allergies  Allergen Reactions  . Simvastatin     Review of Systems negative except from HPI and PMH  Physical Exam BP 114/52  Pulse 75  Ht 5\' 2"  (1.575 m)  Wt 103 lb (46.72 kg)  BMI 18.84 kg/m2 Well developed and well nourished in no acute distress HENT normal E scleral and icterus clear Neck Supple JVP flat; carotids brisk and full Clear to ausculation Regular rate and rhythm, S4 and 2/6 systolic murmur Soft with active bowel sounds No clubbing cyanosis none Edema Alert and oriented, grossly normal motor and sensory function Skin Warm and Dry  Atrial paced  rhythm with an AV interval of about 120 ms there T wave inversions in V3-V6 and 1 and L.    Assessment and  Plan  #1-chest pain #2-intracurrent abnormal Myoview demonstrating new infarct and depressed left ventricular function #3 sinus node dysfunction status post atrial pacer  number #4-hypertension  Given her very abnormal Myoview, and the fact that she is having ongoing chest pain, we will plan to admit her to hospital. We will treat her with heparin as well as continue her aspirin and also add a beta blocker.  We will anticipate catheterization next week not withstanding her age, she is incredibly spry.  Routine surveillance blood work will be obtained including cardiac enzymes and a repeat TSH as her  last one was a little bit low and she may need re\re dosing of her Synthroid

## 2011-05-03 NOTE — Patient Instructions (Signed)
You are being admitted to [facility] for chest pain: you will be going to room 3708.

## 2011-05-04 ENCOUNTER — Encounter (HOSPITAL_COMMUNITY): Payer: Self-pay | Admitting: *Deleted

## 2011-05-04 DIAGNOSIS — I10 Essential (primary) hypertension: Secondary | ICD-10-CM

## 2011-05-04 DIAGNOSIS — I214 Non-ST elevation (NSTEMI) myocardial infarction: Secondary | ICD-10-CM

## 2011-05-04 LAB — CBC
HCT: 33.2 % — ABNORMAL LOW (ref 36.0–46.0)
MCV: 86.5 fL (ref 78.0–100.0)
RBC: 3.84 MIL/uL — ABNORMAL LOW (ref 3.87–5.11)
WBC: 7.7 10*3/uL (ref 4.0–10.5)

## 2011-05-04 LAB — HEPARIN LEVEL (UNFRACTIONATED): Heparin Unfractionated: 0.26 IU/mL — ABNORMAL LOW (ref 0.30–0.70)

## 2011-05-04 LAB — GLUCOSE, CAPILLARY: Glucose-Capillary: 111 mg/dL — ABNORMAL HIGH (ref 70–99)

## 2011-05-04 LAB — CARDIAC PANEL(CRET KIN+CKTOT+MB+TROPI)
CK, MB: 10.6 ng/mL (ref 0.3–4.0)
Relative Index: 4.8 — ABNORMAL HIGH (ref 0.0–2.5)
Relative Index: 4.3 — ABNORMAL HIGH (ref 0.0–2.5)
Total CK: 245 U/L — ABNORMAL HIGH (ref 7–177)
Troponin I: 11.18 ng/mL (ref <0.30)
Troponin I: 9.39 ng/mL (ref <0.30)

## 2011-05-04 LAB — MRSA PCR SCREENING: MRSA by PCR: NEGATIVE

## 2011-05-04 LAB — LIPID PANEL: HDL: 109 mg/dL (ref >39)

## 2011-05-04 MED ORDER — HYDROCHLOROTHIAZIDE 12.5 MG PO CAPS
12.5000 mg | ORAL_CAPSULE | Freq: Every day | ORAL | Status: DC
  Administered 2011-05-05 – 2011-05-10 (×5): 12.5 mg via ORAL
  Filled 2011-05-04 (×7): qty 1

## 2011-05-04 MED ORDER — HEPARIN (PORCINE) IN NACL 100-0.45 UNIT/ML-% IJ SOLN
750.0000 [IU]/h | INTRAMUSCULAR | Status: DC
  Administered 2011-05-05: 750 [IU]/h via INTRAVENOUS
  Filled 2011-05-04 (×2): qty 250

## 2011-05-04 MED ORDER — ALUM & MAG HYDROXIDE-SIMETH 200-200-20 MG/5ML PO SUSP: 15.0000 mL | ORAL | Status: DC | PRN

## 2011-05-04 MED ORDER — METOPROLOL TARTRATE 25 MG PO TABS
25.0000 mg | ORAL_TABLET | Freq: Two times a day (BID) | ORAL | Status: DC
  Administered 2011-05-04: 25 mg via ORAL
  Filled 2011-05-04 (×3): qty 1

## 2011-05-04 NOTE — Progress Notes (Signed)
ANTICOAGULATION CONSULT NOTE - Follow Up Consult  Pharmacy Consult for heparin Indication: chest pain/ACS  Labs:  Basename 05/04/11 0400 05/04/11 0343 05/04/11 0013 05/03/11 1843 05/03/11 1842  HGB -- 11.4* -- 11.9* --  HCT -- 33.2* -- 34.5* --  PLT -- 228 -- 249 --  APTT -- -- -- 35 --  LABPROT -- -- -- 14.1 --  INR -- -- -- 1.07 --  HEPARINUNFRC -- 0.31 -- -- --  CREATININE -- -- -- 0.69 --  CKTOTAL 245* -- 271* -- 345*  CKMB 10.6* -- 13.0* -- 20.4*  TROPONINI 9.39* -- 11.18* -- 14.35*    Assessment: 78yo female therapeutic on heparin with initial dosing for ACS; at low end of goal but given age, size, and renal fxn pt at high risk of accumulation.  Troponin trending down.  Will continue heparin at current rate and confirm stable with additional level.   Colleen Can PharmD BCPS 05/04/2011,5:10 AM

## 2011-05-04 NOTE — Progress Notes (Signed)
SUBJECTIVE: The patient is doing well today.  Presently, she is chest pain free.  She denies SOB.   .   . amLODipine  10 mg Oral Daily  . aspirin EC  325 mg Oral Daily  . cholecalciferol  1,000 Units Oral Daily  . heparin  2,500 Units Intravenous Once  . hydrochlorothiazide  12.5 mg Oral Daily  . isosorbide mononitrate  30 mg Oral Daily  . levothyroxine  100 mcg Oral QAC breakfast  . metoprolol tartrate  12.5 mg Oral BID  . DISCONTD: hydrochlorothiazide  12.5 mg Oral Daily      . sodium chloride    . heparin 550 Units/hr (05/03/11 2112)    OBJECTIVE: Physical Exam: Filed Vitals:   05/03/11 2338 05/04/11 0225 05/04/11 0609 05/04/11 0950  BP: 121/69  136/56 128/62  Pulse: 84  63 69  Temp:   98.7 F (37.1 C)   TempSrc:   Oral   Resp:   18   Height:  5\' 2"  (1.575 m)    Weight:  103 lb 9.9 oz (47 kg)    SpO2:   93%    No intake or output data in the 24 hours ending 05/04/11 1101  Telemetry reveals A paced rhythm  GEN- The patient is elderly but well appearing, alert and oriented x 3 today.   Head- normocephalic, atraumatic Eyes-  Sclera clear, conjunctiva pink Ears- hearing intact Oropharynx- clear Neck- supple, no JVP Lymph- no cervical lymphadenopathy Lungs- Clear to ausculation bilaterally, normal work of breathing Heart- Regular rate and rhythm, no murmurs, rubs or gallops, PMI not laterally displaced GI- soft, NT, ND, + BS Extremities- no clubbing, cyanosis, or edema   LABS: Basic Metabolic Panel:  Basename 05/03/11 1843  NA 140  K 3.6  CL 103  CO2 26  GLUCOSE 94  BUN 17  CREATININE 0.69  CALCIUM 10.1  MG 2.0  PHOS --   Liver Function Tests:  [facility] 05/03/11 1843  AST 54*  ALT 13  ALKPHOS 76  BILITOT 0.5  PROT 6.8  ALBUMIN 3.7   No results found for this basename: LIPASE:2,AMYLASE:2 in the last 72 hours CBC:  Basename 05/04/11 0343 05/03/11 1843  WBC 7.7 7.9  NEUTROABS -- 6.1  HGB 11.4* 11.9*  HCT 33.2* 34.5*  MCV 86.5 88.5  PLT  228 249   Cardiac Enzymes:  Basename 05/04/11 0400 05/04/11 0013 05/03/11 1842  CKTOTAL 245* 271* 345*  CKMB 10.6* 13.0* 20.4*  CKMBINDEX -- -- --  TROPONINI 9.39* 11.18* 14.35*    Fasting Lipid Panel:  Basename 05/04/11 0344  CHOL 218*  HDL 109  LDLCALC 100*  TRIG 46  CHOLHDL 2.0  LDLDIRECT --   Thyroid Function Tests:  Basename 05/03/11 1843  TSH 0.886  T4TOTAL --  T3FREE --  THYROIDAB --   Anemia Panel: No results found for this basename: VITAMINB12,FOLATE,FERRITIN,TIBC,IRON,RETICCTPCT in the last 72 hours  RADIOLOGY: No results found.  ASSESSMENT AND PLAN:  Active Problems:  * No active hospital problems. *   1.  NSTEMI- pt with robust elevation in CMs, though they are now down trending and she is chest pain free. Will add beta blocker and statin.  Will anticipate cath as per Dr Graciela Husbands on Monday.  IF her chest pain worsens over the weekend, we could consider more urgent cath.  2. HTN- stable, no changes  Code status:  She is not certain.  She will discuss with family.  Hillis Range, MD 05/04/2011 11:01 AM

## 2011-05-04 NOTE — Progress Notes (Signed)
ANTICOAGULATION CONSULT NOTE - Follow Up Consult  Pharmacy Consult for Heparin Indication: chest pain/ACS  Allergies  Allergen Reactions  . Simvastatin     unknown    Patient Measurements: Height: 5\' 2"  (157.5 cm) Weight: 103 lb 9.9 oz (47 kg) IBW/kg (Calculated) : 50.1  Heparin Dosing Weight: 47 kg  Vital Signs: Temp: 98.7 F (37.1 C) (04/13 0609) Temp src: Oral (04/13 0609) BP: 128/62 mmHg (04/13 0950) Pulse Rate: 69  (04/13 0950)  Labs:  Basename 05/04/11 1048 05/04/11 0400 05/04/11 0343 05/04/11 0013 05/03/11 1843 05/03/11 1842  HGB -- -- 11.4* -- 11.9* --  HCT -- -- 33.2* -- 34.5* --  PLT -- -- 228 -- 249 --  APTT -- -- -- -- 35 --  LABPROT -- -- -- -- 14.1 --  INR -- -- -- -- 1.07 --  HEPARINUNFRC 0.25* -- 0.31 -- -- --  CREATININE -- -- -- -- 0.69 --  CKTOTAL -- 245* -- 271* -- 345*  CKMB -- 10.6* -- 13.0* -- 20.4*  TROPONINI -- 9.39* -- 11.18* -- 14.35*   Estimated Creatinine Clearance: 36.8 ml/min (by C-G formula based on Cr of 0.69).  Assessment:   Heparin level is now subtherapeutic on 550 units/hr.   Down from initial level of 0.31, not accumulating.   Anticipating cardiac cath on 4/15, sooner if chest pain worsens.  Goal of Therapy:  Heparin level 0.3-0.7 units/ml   Plan:    Will increase heparin to 650 units/hr.   Next level in ~8 hrs.  Dennie Fetters, [place] Pager: [phone] 05/04/2011,12:16 PM

## 2011-05-04 NOTE — Progress Notes (Signed)
ANTICOAGULATION CONSULT NOTE - Follow Up Consult  Pharmacy Consult for Heparin Indication: chest pain/ACS  Allergies  Allergen Reactions  . Simvastatin     unknown    Patient Measurements: Height: 5\' 2"  (157.5 cm) Weight: 103 lb 9.9 oz (47 kg) IBW/kg (Calculated) : 50.1  Heparin Dosing Weight: 47 kg  Vital Signs: Temp: 98.4 F (36.9 C) (04/13 1400) Temp src: Oral (04/13 1400) BP: 118/64 mmHg (04/13 2121) Pulse Rate: 64  (04/13 2121)  Labs:  Basename 05/04/11 2021 05/04/11 1048 05/04/11 0400 05/04/11 0343 05/04/11 0013 05/03/11 1843 05/03/11 1842  HGB -- -- -- 11.4* -- 11.9* --  HCT -- -- -- 33.2* -- 34.5* --  PLT -- -- -- 228 -- 249 --  APTT -- -- -- -- -- 35 --  LABPROT -- -- -- -- -- 14.1 --  INR -- -- -- -- -- 1.07 --  HEPARINUNFRC 0.26* 0.25* -- 0.31 -- -- --  CREATININE -- -- -- -- -- 0.69 --  CKTOTAL -- -- 245* -- 271* -- 345*  CKMB -- -- 10.6* -- 13.0* -- 20.4*  TROPONINI -- -- 9.39* -- 11.18* -- 14.35*   Estimated Creatinine Clearance: 36.8 ml/min (by C-G formula based on Cr of 0.69).  Assessment: 78 yo female on heparin for NSTEMI and heparin level is below goal at 0.26. Patient noted for cath on 4/15.  Goal of Therapy:  Heparin level 0.3-0.7 units/ml   Plan:  -Will increase heparin to 750 units/hr.  -Next level in am  Benny Lennert, Pharm D 05/04/2011,9:22 PM

## 2011-05-05 LAB — GLUCOSE, CAPILLARY: Glucose-Capillary: 84 mg/dL (ref 70–99)

## 2011-05-05 MED ORDER — HEART ATTACK BOUNCING BOOK
Freq: Once | Status: AC
  Administered 2011-05-05: 04:00:00
  Filled 2011-05-05: qty 1

## 2011-05-05 MED ORDER — METOPROLOL TARTRATE 25 MG PO TABS
37.5000 mg | ORAL_TABLET | Freq: Two times a day (BID) | ORAL | Status: DC
  Administered 2011-05-05 – 2011-05-10 (×11): 37.5 mg via ORAL
  Filled 2011-05-05 (×12): qty 1

## 2011-05-05 NOTE — Progress Notes (Signed)
SUBJECTIVE: The patient is doing well today.  Presently, she is chest pain free.  She denies SOB.    Marland Kitchen amLODipine  10 mg Oral Daily  . aspirin EC  325 mg Oral Daily  . cholecalciferol  1,000 Units Oral Daily  . heart attack bouncing book   Does not apply Once  . hydrochlorothiazide  12.5 mg Oral Daily  . isosorbide mononitrate  30 mg Oral Daily  . levothyroxine  100 mcg Oral QAC breakfast  . metoprolol tartrate  37.5 mg Oral BID  . DISCONTD: hydrochlorothiazide  12.5 mg Oral Daily  . DISCONTD: metoprolol tartrate  12.5 mg Oral BID  . DISCONTD: metoprolol tartrate  25 mg Oral BID      . sodium chloride    . heparin 750 Units/hr (05/05/11 0554)  . DISCONTD: heparin 650 Units/hr (05/04/11 1241)    OBJECTIVE: Physical Exam: Filed Vitals:   05/04/11 1400 05/04/11 2100 05/04/11 2121 05/05/11 0500  BP: 104/51  118/64 138/64  Pulse: 89 65 64 80  Temp: 98.4 F (36.9 C) 97.8 F (36.6 C)  97.6 F (36.4 C)  TempSrc: Oral Oral  Oral  Resp: 22 18  18   Height:      Weight:    97 lb 7.1 oz (44.2 kg)  SpO2: 98% 97%  98%    Intake/Output Summary (Last 24 hours) at 05/05/11 0944 Last data filed at 05/05/11 4098  Gross per 24 hour  Intake 386.63 ml  Output    125 ml  Net 261.63 ml    Telemetry reveals A paced rhythm  GEN- The patient is elderly but well appearing, alert and oriented x 3 today.   Head- normocephalic, atraumatic Eyes-  Sclera clear, conjunctiva pink Ears- hearing intact Oropharynx- clear Neck- supple, no JVP Lymph- no cervical lymphadenopathy Lungs- Clear to ausculation bilaterally, normal work of breathing Heart- Regular rate and rhythm, no murmurs, rubs or gallops, PMI not laterally displaced GI- soft, NT, ND, + BS Extremities- no clubbing, cyanosis, or edema   LABS: Basic Metabolic Panel:  Basename 05/03/11 1843  NA 140  K 3.6  CL 103  CO2 26  GLUCOSE 94  BUN 17  CREATININE 0.69  CALCIUM 10.1  MG 2.0  PHOS --   Liver Function  Tests:  [facility] 05/03/11 1843  AST 54*  ALT 13  ALKPHOS 76  BILITOT 0.5  PROT 6.8  ALBUMIN 3.7   No results found for this basename: LIPASE:2,AMYLASE:2 in the last 72 hours CBC:  Basename 05/04/11 0343 05/03/11 1843  WBC 7.7 7.9  NEUTROABS -- 6.1  HGB 11.4* 11.9*  HCT 33.2* 34.5*  MCV 86.5 88.5  PLT 228 249   Cardiac Enzymes:  Basename 05/04/11 0400 05/04/11 0013 05/03/11 1842  CKTOTAL 245* 271* 345*  CKMB 10.6* 13.0* 20.4*  CKMBINDEX -- -- --  TROPONINI 9.39* 11.18* 14.35*    Fasting Lipid Panel:  Basename 05/04/11 0344  CHOL 218*  HDL 109  LDLCALC 100*  TRIG 46  CHOLHDL 2.0  LDLDIRECT --   Thyroid Function Tests:  Basename 05/03/11 1843  TSH 0.886  T4TOTAL --  T3FREE --  THYROIDAB --   Anemia Panel: No results found for this basename: VITAMINB12,FOLATE,FERRITIN,TIBC,IRON,RETICCTPCT in the last 72 hours  RADIOLOGY: No results found.  ASSESSMENT AND PLAN:  Active Problems:  HYPERLIPIDEMIA  HYPERTENSION  Sinoatrial node dysfunction  NSTEMI (non-ST elevated myocardial infarction)  1.  NSTEMI- pt with robust elevation in CMs, though they are now down trending and she is  chest pain free.  Currently sitting in a chair reading the paper and looking quite spry. Will increase beta blocker and likely add statin.  I had a long discussion with the patient and her son about risks, benefits, and alternatives of cath.  They are aware of her increased risks with cath based on prior conversations with Dr Drue Novel and are appropriately concerned.  They wish to further contemplate the risks/ benefits of cath vs medical therapy and make this informed decision in the am. I will therefore continue to optimize her medicines today.  She will let us know in the am if she wishes to proceed with cath.  2. HTN- stable, increase metoprolol  Code status:  She is not certain. I discussed with her son.  They will have ongoing family discussions about code status going  forward.  Hillis Range, MD 05/05/2011 9:44 AM

## 2011-05-05 NOTE — Progress Notes (Signed)
ANTICOAGULATION CONSULT NOTE - Follow Up Consult  Pharmacy Consult for Heparin Indication: chest pain/ACS  Allergies  Allergen Reactions  . Simvastatin     unknown    Patient Measurements: Height: 5\' 2"  (157.5 cm) Weight: 97 lb 7.1 oz (44.2 kg) IBW/kg (Calculated) : 50.1  Heparin Dosing Weight: 47 kg  Vital Signs: Temp: 97.6 F (36.4 C) (04/14 0500) Temp src: Oral (04/14 0500) BP: 140/75 mmHg (04/14 1044) Pulse Rate: 75  (04/14 1044)  Labs:  Basename 05/05/11 0500 05/04/11 2021 05/04/11 1048 05/04/11 0400 05/04/11 0343 05/04/11 0013 05/03/11 1843 05/03/11 1842  HGB -- -- -- -- 11.4* -- 11.9* --  HCT -- -- -- -- 33.2* -- 34.5* --  PLT -- -- -- -- 228 -- 249 --  APTT -- -- -- -- -- -- 35 --  LABPROT -- -- -- -- -- -- 14.1 --  INR -- -- -- -- -- -- 1.07 --  HEPARINUNFRC 0.60 0.26* 0.25* -- -- -- -- --  CREATININE -- -- -- -- -- -- 0.69 --  CKTOTAL -- -- -- 245* -- 271* -- 345*  CKMB -- -- -- 10.6* -- 13.0* -- 20.4*  TROPONINI -- -- -- 9.39* -- 11.18* -- 14.35*   Estimated Creatinine Clearance: 34.6 ml/min (by C-G formula based on Cr of 0.69).  Assessment: 78 yo female admitted after markedly abnormal myoview. NSTEMI To begin IV heparin while awaiting cath on Monday. Heparin level 0.6 in goal range.  Cardiovascular -Cholesterol 28, LD 100; HDL 109! Hx tacy-brady, has pacer. Meds: asa 325, Imdur 30, Amlod 10 mg, HCTZ 12.5 mg, metoprolol  Endocrinology , TSH ok, on home Synthroid 100 mcg  Goal of Therapy:  Heparin level 0.3-0.7 units/ml   Plan:  -Continue heparin 750 units/hr Next heparin level in am.  Pasty Spillers, Pharm D 05/05/2011,10:53 AM

## 2011-05-06 ENCOUNTER — Encounter (HOSPITAL_COMMUNITY)
Admission: AD | Disposition: A | Discharge status: Home / Self Care | Payer: Self-pay | Source: Ambulatory Visit | Attending: Internal Medicine

## 2011-05-06 DIAGNOSIS — I2510 Atherosclerotic heart disease of native coronary artery without angina pectoris: Secondary | ICD-10-CM

## 2011-05-06 HISTORY — PX: LEFT HEART CATHETERIZATION WITH CORONARY ANGIOGRAM: SHX5451

## 2011-05-06 LAB — CBC
HCT: 33.4 % — ABNORMAL LOW (ref 36.0–46.0)
MCHC: 33.8 g/dL (ref 30.0–36.0)
MCV: 88.8 fL (ref 78.0–100.0)
Platelets: 236 10*3/uL (ref 150–400)
RDW: 14.9 % (ref 11.5–15.5)
WBC: 9 10*3/uL (ref 4.0–10.5)

## 2011-05-06 LAB — POCT ACTIVATED CLOTTING TIME: Activated Clotting Time: 149 seconds

## 2011-05-06 LAB — HEPARIN LEVEL (UNFRACTIONATED): Heparin Unfractionated: 0.68 IU/mL (ref 0.30–0.70)

## 2011-05-06 LAB — BASIC METABOLIC PANEL
BUN: 12 mg/dL (ref 6–23)
Calcium: 9.7 mg/dL (ref 8.4–10.5)
Chloride: 102 mEq/L (ref 96–112)
Creatinine, Ser: 0.69 mg/dL (ref 0.50–1.10)
GFR calc Af Amer: 88 mL/min — ABNORMAL LOW (ref >90)
GFR calc non Af Amer: 76 mL/min — ABNORMAL LOW (ref >90)

## 2011-05-06 SURGERY — LEFT HEART CATHETERIZATION WITH CORONARY ANGIOGRAM
Anesthesia: LOCAL

## 2011-05-06 MED ORDER — SODIUM CHLORIDE 0.9 % IJ SOLN
3.0000 mL | Freq: Two times a day (BID) | INTRAMUSCULAR | Status: DC
  Administered 2011-05-07: 3 mL via INTRAVENOUS

## 2011-05-06 MED ORDER — SODIUM CHLORIDE 0.9 % IV SOLN: 250.0000 mL | INTRAVENOUS | Status: DC

## 2011-05-06 MED ORDER — NITROGLYCERIN 0.2 MG/ML ON CALL CATH LAB
INTRAVENOUS | Status: AC
  Filled 2011-05-06: qty 1

## 2011-05-06 MED ORDER — ONDANSETRON HCL 4 MG/2ML IJ SOLN: 4.0000 mg | Freq: Four times a day (QID) | INTRAMUSCULAR | Status: DC | PRN

## 2011-05-06 MED ORDER — ASPIRIN 81 MG PO CHEW
CHEWABLE_TABLET | ORAL | Status: AC
  Administered 2011-05-06: 324 mg via ORAL
  Filled 2011-05-06: qty 4

## 2011-05-06 MED ORDER — ASPIRIN EC 325 MG PO TBEC: 325.0000 mg | DELAYED_RELEASE_TABLET | Freq: Every day | ORAL | Status: DC

## 2011-05-06 MED ORDER — CLOPIDOGREL BISULFATE 75 MG PO TABS
75.0000 mg | ORAL_TABLET | Freq: Every day | ORAL | Status: DC
  Administered 2011-05-07 – 2011-05-10 (×4): 75 mg via ORAL
  Filled 2011-05-06 (×4): qty 1

## 2011-05-06 MED ORDER — FENTANYL CITRATE 0.05 MG/ML IJ SOLN
INTRAMUSCULAR | Status: AC
  Filled 2011-05-06: qty 2

## 2011-05-06 MED ORDER — LIDOCAINE HCL (PF) 1 % IJ SOLN
INTRAMUSCULAR | Status: AC
  Filled 2011-05-06: qty 30

## 2011-05-06 MED ORDER — SODIUM CHLORIDE 0.9 % IV SOLN: 1.0000 mL/kg/h | INTRAVENOUS | Status: AC

## 2011-05-06 MED ORDER — ASPIRIN 81 MG PO CHEW
324.0000 mg | CHEWABLE_TABLET | Freq: Once | ORAL | Status: AC
  Administered 2011-05-06 (×2): 324 mg via ORAL

## 2011-05-06 MED ORDER — ASPIRIN 81 MG PO CHEW
81.0000 mg | CHEWABLE_TABLET | Freq: Every day | ORAL | Status: DC
  Administered 2011-05-07 – 2011-05-10 (×4): 81 mg via ORAL
  Filled 2011-05-06 (×3): qty 1

## 2011-05-06 MED ORDER — SODIUM CHLORIDE 0.9 % IJ SOLN: 3.0000 mL | INTRAMUSCULAR | Status: DC | PRN

## 2011-05-06 MED ORDER — MIDAZOLAM HCL 2 MG/2ML IJ SOLN
INTRAMUSCULAR | Status: AC
  Filled 2011-05-06: qty 2

## 2011-05-06 MED ORDER — HEPARIN (PORCINE) IN NACL 2-0.9 UNIT/ML-% IJ SOLN
INTRAMUSCULAR | Status: AC
  Filled 2011-05-06: qty 2000

## 2011-05-06 MED ORDER — ACETAMINOPHEN 325 MG PO TABS: 650.0000 mg | ORAL_TABLET | ORAL | Status: DC | PRN

## 2011-05-06 NOTE — CV Procedure (Signed)
   Cardiac Catheterization Procedure Note  Name: Lori Trujillo MRN: [id number] DOB: [birth year]  Procedure: Left Heart Cath, Selective Coronary Angiography, LV angiography  Indication: non-ST elevation MI  Procedural details: The right groin was prepped, draped, and anesthetized with 1% lidocaine. Using modified Seldinger technique, a 5 French sheath was introduced into the right femoral artery. Standard Judkins catheters were used for coronary angiography and left ventriculography. Catheter exchanges were performed over a guidewire. There were no immediate procedural complications. The patient was transferred to the post catheterization recovery area for further monitoring.  Procedural Findings: Hemodynamics:  AO 126/49 with a mean of 78 LV 125/14   Coronary angiography: Coronary dominance: right  Left mainstem: the left main is severely calcified. There is severe distal left main disease of 90%.  Left anterior descending (LAD): the LAD has critical, heavily calcified ostial stenosis. There is slow flow into the first diagonal branch and competitive flow into the mid LAD. The mid and distal LAD fill competitively from right to left collaterals.  Left circumflex (LCx): the left circumflex also has subtotal occlusion with severe 95% calcification involving the distal left mainstem. The vessel gives off 2 tiny OM branches and a moderate caliber third OM that has 60% proximal stenosis.  Right coronary artery (RCA): the RCA is severely calcified. The proximal vessel has 70% stenosis. The mid vessel is patent. The distal vessel has 50% stenosis. The PDA and posterolateral branches are diffusely diseased but patent.  Left ventriculography:there is severe hypokinesis of the distal anterior wall and akinesis of the apex. The basal inferior and basal anterior walls contract normally. The left ventricular ejection fraction is estimated at 35%.  Final Conclusions:   1. Critical distal left main  disease extending into the LAD and left circumflex with severe calcification 2. Total occlusion of the  LAD with right to left collaterals 3. Moderate diffuse right coronary artery stenosis 4. Severe segmental left ventricular systolic dysfunction  Recommendations: unfortunately, I don't think this frail 78 year old woman has reasonable revascularization options. She is certainly not a candidate for coronary bypass surgery. Her coronary anatomy would be extremely high risk for any type of PCI procedure. The only potentially feasible option would involve rotational atherectomy of the distal left main into the circumflex which would carry very high risk. I would favor medical therapy unless she has refractory angina with recurrent non-ST elevation infarction and in that setting high-risk PCI could be considered. I will start her on Plavix.  Tonny Bollman 05/06/2011, 4:01 PM

## 2011-05-06 NOTE — H&P (View-Only) (Signed)
SUBJECTIVE: The patient is doing well today.  Presently, she is chest pain free.  She denies SOB.    . amLODipine  10 mg Oral Daily  . aspirin EC  325 mg Oral Daily  . cholecalciferol  1,000 Units Oral Daily  . heart attack bouncing book   Does not apply Once  . hydrochlorothiazide  12.5 mg Oral Daily  . isosorbide mononitrate  30 mg Oral Daily  . levothyroxine  100 mcg Oral QAC breakfast  . metoprolol tartrate  37.5 mg Oral BID  . DISCONTD: hydrochlorothiazide  12.5 mg Oral Daily  . DISCONTD: metoprolol tartrate  12.5 mg Oral BID  . DISCONTD: metoprolol tartrate  25 mg Oral BID      . sodium chloride    . heparin 750 Units/hr (05/05/11 0554)  . DISCONTD: heparin 650 Units/hr (05/04/11 1241)    OBJECTIVE: Physical Exam: Filed Vitals:   05/04/11 1400 05/04/11 2100 05/04/11 2121 05/05/11 0500  BP: 104/51  118/64 138/64  Pulse: 89 65 64 80  Temp: 98.4 F (36.9 C) 97.8 F (36.6 C)  97.6 F (36.4 C)  TempSrc: Oral Oral  Oral  Resp: 22 18  18  Height:      Weight:    97 lb 7.1 oz (44.2 kg)  SpO2: 98% 97%  98%    Intake/Output Summary (Last 24 hours) at 05/05/11 0944 Last data filed at 05/05/11 0907  Gross per 24 hour  Intake 386.63 ml  Output    125 ml  Net 261.63 ml    Telemetry reveals A paced rhythm  GEN- The patient is elderly but well appearing, alert and oriented x 3 today.   Head- normocephalic, atraumatic Eyes-  Sclera clear, conjunctiva pink Ears- hearing intact Oropharynx- clear Neck- supple, no JVP Lymph- no cervical lymphadenopathy Lungs- Clear to ausculation bilaterally, normal work of breathing Heart- Regular rate and rhythm, no murmurs, rubs or gallops, PMI not laterally displaced GI- soft, NT, ND, + BS Extremities- no clubbing, cyanosis, or edema   LABS: Basic Metabolic Panel:  Basename 05/03/11 1843  NA 140  K 3.6  CL 103  CO2 26  GLUCOSE 94  BUN 17  CREATININE 0.69  CALCIUM 10.1  MG 2.0  PHOS --   Liver Function  Tests:  Basename 05/03/11 1843  AST 54*  ALT 13  ALKPHOS 76  BILITOT 0.5  PROT 6.8  ALBUMIN 3.7   No results found for this basename: LIPASE:2,AMYLASE:2 in the last 72 hours CBC:  Basename 05/04/11 0343 05/03/11 1843  WBC 7.7 7.9  NEUTROABS -- 6.1  HGB 11.4* 11.9*  HCT 33.2* 34.5*  MCV 86.5 88.5  PLT 228 249   Cardiac Enzymes:  Basename 05/04/11 0400 05/04/11 0013 05/03/11 1842  CKTOTAL 245* 271* 345*  CKMB 10.6* 13.0* 20.4*  CKMBINDEX -- -- --  TROPONINI 9.39* 11.18* 14.35*    Fasting Lipid Panel:  Basename 05/04/11 0344  CHOL 218*  HDL 109  LDLCALC 100*  TRIG 46  CHOLHDL 2.0  LDLDIRECT --   Thyroid Function Tests:  Basename 05/03/11 1843  TSH 0.886  T4TOTAL --  T3FREE --  THYROIDAB --   Anemia Panel: No results found for this basename: VITAMINB12,FOLATE,FERRITIN,TIBC,IRON,RETICCTPCT in the last 72 hours  RADIOLOGY: No results found.  ASSESSMENT AND PLAN:  Active Problems:  HYPERLIPIDEMIA  HYPERTENSION  Sinoatrial node dysfunction  NSTEMI (non-ST elevated myocardial infarction)  1.  NSTEMI- pt with robust elevation in CMs, though they are now down trending and she is   chest pain free.  Currently sitting in a chair reading the paper and looking quite spry. Will increase beta blocker and likely add statin.  I had a long discussion with the patient and her son about risks, benefits, and alternatives of cath.  They are aware of her increased risks with cath based on prior conversations with Dr Paz and are appropriately concerned.  They wish to further contemplate the risks/ benefits of cath vs medical therapy and make this informed decision in the am. I will therefore continue to optimize her medicines today.  She will let us know in the am if she wishes to proceed with cath.  2. HTN- stable, increase metoprolol  Code status:  She is not certain. I discussed with her son.  They will have ongoing family discussions about code status going  forward.  Julena Barbour, MD 05/05/2011 9:44 AM  

## 2011-05-06 NOTE — Interval H&P Note (Signed)
History and Physical Interval Note:  05/06/2011 3:15 PM  Lori Trujillo  has presented today for surgery, with the diagnosis of chest pain  The various methods of treatment have been discussed with the patient and family. After consideration of risks, benefits and other options for treatment, the patient has consented to  Procedure(s) (LRB): LEFT HEART CATHETERIZATION WITH CORONARY ANGIOGRAM (N/A) as a surgical intervention .  The patients' history has been reviewed, patient examined, no change in status, stable for surgery.  I have reviewed the patients' chart and labs.  Questions were answered to the patient's satisfaction.     Tonny Bollman  05/06/2011 3:15 PM

## 2011-05-06 NOTE — Progress Notes (Signed)
ANTICOAGULATION CONSULT NOTE - Follow Up Consult  Pharmacy Consult for Heparin Indication: chest pain/ACS  Allergies  Allergen Reactions  . Simvastatin     unknown    Patient Measurements: Height: 5\' 2"  (157.5 cm) Weight: 95 lb 4.8 oz (43.228 kg) IBW/kg (Calculated) : 50.1  Heparin Dosing Weight: 43 kg  Vital Signs: Temp: 98.8 F (37.1 C) (04/15 1349) Temp src: Oral (04/15 1349) BP: 143/62 mmHg (04/15 1349) Pulse Rate: 60  (04/15 1349)  Labs:  Basename 05/06/11 0635 05/05/11 0500 05/04/11 2021 05/04/11 0400 05/04/11 0343 05/04/11 0013 05/03/11 1843 05/03/11 1842  HGB 11.3* -- -- -- 11.4* -- -- --  HCT 33.4* -- -- -- 33.2* -- 34.5* --  PLT 236 -- -- -- 228 -- 249 --  APTT -- -- -- -- -- -- 35 --  LABPROT -- -- -- -- -- -- 14.1 --  INR -- -- -- -- -- -- 1.07 --  HEPARINUNFRC 0.68 0.60 0.26* -- -- -- -- --  CREATININE 0.69 -- -- -- -- -- 0.69 --  CKTOTAL -- -- -- 245* -- 271* -- 345*  CKMB -- -- -- 10.6* -- 13.0* -- 20.4*  TROPONINI -- -- -- 9.39* -- 11.18* -- 14.35*   Estimated Creatinine Clearance: 33.8 ml/min (by C-G formula based on Cr of 0.69).   Medications:  Infusions:    . sodium chloride 50 mL/hr at 05/06/11 0359  . heparin 750 Units/hr (05/05/11 1800)    Assessment: 78 y.o. F on heparin for ACS sx while awaiting cardiac cath today. Heparin level this morning is therapeutic though on higher end of range (HL 0.68, goal of 0.3-0.7). Hgb/Hct/Plt stable. No s/sx of bleeding noted. Heparin infusing at appropriate rate.  Goal of Therapy:  Heparin level 0.3-0.7 units/ml   Plan:  1. Continue heparin at 750 units/hr (7.5 ml/hr) as patient next in line for cath procedure today. 2. Will continue to monitor for any signs/symptoms of bleeding and will follow up with heparin plans post cath today.   Georgina Pillion, PharmD, BCPS Clinical Pharmacist Pager: [phone] 05/06/2011 2:18 PM

## 2011-05-07 LAB — CBC
HCT: 30.2 % — ABNORMAL LOW (ref 36.0–46.0)
MCHC: 33.8 g/dL (ref 30.0–36.0)
Platelets: 245 10*3/uL (ref 150–400)
RDW: 15.2 % (ref 11.5–15.5)
WBC: 7.6 10*3/uL (ref 4.0–10.5)

## 2011-05-07 MED ORDER — ENOXAPARIN SODIUM 40 MG/0.4ML ~~LOC~~ SOLN
40.0000 mg | Freq: Two times a day (BID) | SUBCUTANEOUS | Status: DC
  Administered 2011-05-08 – 2011-05-10 (×5): 40 mg via SUBCUTANEOUS
  Filled 2011-05-07 (×8): qty 0.4

## 2011-05-07 MED ORDER — ENOXAPARIN SODIUM 40 MG/0.4ML ~~LOC~~ SOLN
40.0000 mg | Freq: Once | SUBCUTANEOUS | Status: AC
  Administered 2011-05-07: 40 mg via SUBCUTANEOUS
  Filled 2011-05-07: qty 0.4

## 2011-05-07 NOTE — Progress Notes (Signed)
  Patient Name: Lori Trujillo      SUBJECTIVE: Cath results noted Felt not to be CABG candidate nor likely PCI   lenghty discussion revieewing results and plans  Past Medical History  Diagnosis Date  . Hyperlipidemia   . Hypertension   . Hypothyroidism 6/11    Thyroid ablation, s/p treatment with iodine  . Tachy-brady syndrome 2003    Per holter, s/p pacemaker implantation in [place] (pacemaker replaced 11/11)  . Hypercalcemia   . TIA (transient ischemic attack) 2000    ?TIA; carotid u/s 1-15% and brain MRI lacunar dz  . Chronic neck pain 2002  . DJD (degenerative joint disease) of cervical spine 2002    MRI Cspine  . Echocardiogram shows left ventricular diastolic dysfunction 2002    And (-) stress ECHO (diastolic dysfunction later improved)  . Pneumonia March 2013    PHYSICAL EXAM Filed Vitals:   05/06/11 2154 05/06/11 2212 05/07/11 0500 05/07/11 1010  BP: 102/40 109/44 111/96 103/55  Pulse:  64 63 60  Temp:   98.9 F (37.2 C)   TempSrc:   Oral   Resp:   18   Height:      Weight:      SpO2:   95%     Well developed and nourished in no acute distress HENT normal Neck supple with JVP-8-10 crackles Regular rate and rhythm, no murmurs or gallops Abd-soft with active BS No Clubbing cyanosis edema Skin-warm and dry A & Oriented  Grossly normal sensory and motor function   TELEMETRY: Reviewed telemetry pt in nsr    Intake/Output Summary (Last 24 hours) at 05/07/11 1355 Last data filed at 05/06/11 2030  Gross per 24 hour  Intake      0 ml  Output    500 ml  Net   -500 ml    LABS: Basic Metabolic Panel:  Lab 05/06/11 9604 05/03/11 1843  NA 135 140  K 3.8 3.6  CL 102 103  CO2 25 26  GLUCOSE 116* 94  BUN 12 17  CREATININE 0.69 0.69  CALCIUM 9.7 10.1  MG -- 2.0  PHOS -- --   Cardiac Enzymes: No results found for this basename: CKTOTAL:3,CKMB:3,CKMBINDEX:3,TROPONINI:3 in the last 72 hours CBC:  Lab 05/07/11 0635 05/06/11 0635 05/04/11 0343  05/03/11 1843  WBC 7.6 9.0 7.7 7.9  NEUTROABS -- -- -- 6.1  HGB 10.2* 11.3* 11.4* 11.9*  HCT 30.2* 33.4* 33.2* 34.5*  MCV 88.6 88.8 86.5 88.5  PLT 245 236 228 249  Anemia Panel: No results found for this basename: VITAMINB12,FOLATE,FERRITIN,TIBC,IRON,RETICCTPCT in the last 72 hours   D*   ASSESSMENT AND PLAN:  Patient Active Hospital Problem List: HYPERLIPIDEMIA (07/28/2006)   HYPERTENSION (07/28/2006)   Sinoatrial node dysfunction (07/02/2008)   NSTEMI (non-ST elevated myocardial infarction) (05/04/2011)  *severe 3V CAD For now will continue current meds casemagr consultant Cardiac rehab Consider ranexa as BP lay be limiting LMWH full dose while in hospital     Signed, Sherryl Manges MD  05/07/2011

## 2011-05-07 NOTE — Progress Notes (Signed)
ANTICOAGULATION CONSULT NOTE - Initial Consult  Pharmacy Consult for Lovenox Indication: Severe 3V CAD  Allergies  Allergen Reactions  . Simvastatin     unknown    Patient Measurements: Height: 5\' 2"  (157.5 cm) Weight: 95 lb 4.8 oz (43.228 kg) IBW/kg (Calculated) : 50.1   Vital Signs: Temp: 98.9 F (37.2 C) (04/16 0500) Temp src: Oral (04/16 0500) BP: 103/55 mmHg (04/16 1010) Pulse Rate: 60  (04/16 1010)  Labs:  Basename 05/07/11 0635 05/06/11 0635 05/05/11 0500 05/04/11 2021  HGB 10.2* 11.3* -- --  HCT 30.2* 33.4* -- --  PLT 245 236 -- --  APTT -- -- -- --  LABPROT -- -- -- --  INR -- -- -- --  HEPARINUNFRC -- 0.68 0.60 0.26*  CREATININE -- 0.69 -- --  CKTOTAL -- -- -- --  CKMB -- -- -- --  TROPONINI -- -- -- --   Estimated Creatinine Clearance: 33.8 ml/min (by C-G formula based on Cr of 0.69).  Medical History: Past Medical History  Diagnosis Date  . Hyperlipidemia   . Hypertension   . Hypothyroidism 6/11    Thyroid ablation, s/p treatment with iodine  . Tachy-brady syndrome 2003    Per holter, s/p pacemaker implantation in [place] (pacemaker replaced 11/11)  . Hypercalcemia   . TIA (transient ischemic attack) 2000    ?TIA; carotid u/s 1-15% and brain MRI lacunar dz  . Chronic neck pain 2002  . DJD (degenerative joint disease) of cervical spine 2002    MRI Cspine  . Echocardiogram shows left ventricular diastolic dysfunction 2002    And (-) stress ECHO (diastolic dysfunction later improved)  . Pneumonia March 2013    Assessment: 78 y.o. F to start full dose lovenox for anticoagulation after cardiac cath revealed severe 3V CAD on 4/16. The patient was on heparin prior to the cath procedure -- but this has been stopped since 4/15 around 1200. Wt: 43.2 kg, SCr: 0.69, CrCl~30-35 ml/min.   Goal of Therapy:  Anti-Xa level of 0.6-1.2 units/ml   Plan:  1. Lovenox 40 mg SQ x1 dose now followed by every 12 hours (will get on 0600/1800 schedule) 2. Will continue  to monitor for any signs/symptoms of bleeding and will check BMET in the a.m and every 72 hours to further evaluate renal function.   Georgina Pillion, PharmD, BCPS Clinical Pharmacist Pager: [phone] 05/07/2011 2:37 PM

## 2011-05-08 LAB — CBC
Platelets: 278 10*3/uL (ref 150–400)
RBC: 3.77 MIL/uL — ABNORMAL LOW (ref 3.87–5.11)
RDW: 15 % (ref 11.5–15.5)
WBC: 6.3 10*3/uL (ref 4.0–10.5)

## 2011-05-08 LAB — BASIC METABOLIC PANEL
CO2: 28 mEq/L (ref 19–32)
Chloride: 101 mEq/L (ref 96–112)
Creatinine, Ser: 0.67 mg/dL (ref 0.50–1.10)
GFR calc Af Amer: 89 mL/min — ABNORMAL LOW (ref >90)
Potassium: 4.5 mEq/L (ref 3.5–5.1)
Sodium: 138 mEq/L (ref 135–145)

## 2011-05-08 MED ORDER — AMLODIPINE BESYLATE 5 MG PO TABS
5.0000 mg | ORAL_TABLET | Freq: Every day | ORAL | Status: DC
  Filled 2011-05-08: qty 1

## 2011-05-08 MED ORDER — AMLODIPINE BESYLATE 2.5 MG PO TABS
2.5000 mg | ORAL_TABLET | Freq: Every day | ORAL | Status: DC
  Administered 2011-05-08 – 2011-05-10 (×3): 2.5 mg via ORAL
  Filled 2011-05-08 (×5): qty 1

## 2011-05-08 MED ORDER — ISOSORBIDE MONONITRATE ER 60 MG PO TB24
60.0000 mg | ORAL_TABLET | Freq: Every day | ORAL | Status: DC
  Filled 2011-05-08: qty 1

## 2011-05-08 MED ORDER — ISOSORBIDE MONONITRATE ER 60 MG PO TB24
90.0000 mg | ORAL_TABLET | Freq: Every day | ORAL | Status: DC
  Administered 2011-05-08 – 2011-05-10 (×3): 90 mg via ORAL
  Filled 2011-05-08 (×4): qty 1

## 2011-05-08 NOTE — Progress Notes (Signed)
  Patient Name: Lori Trujillo      SUBJECTIVE: Cath results noted Felt not to be CABG candidate nor likely PCI   Some chest heaviness this am with ambulation  lenghty discussion revieewing results and plans  Cardiac and case manager consults pending  Past Medical History  Diagnosis Date  . Hyperlipidemia   . Hypertension   . Hypothyroidism 6/11    Thyroid ablation, s/p treatment with iodine  . Tachy-brady syndrome 2003    Per holter, s/p pacemaker implantation in [place] (pacemaker replaced 11/11)  . Hypercalcemia   . TIA (transient ischemic attack) 2000    ?TIA; carotid u/s 1-15% and brain MRI lacunar dz  . Chronic neck pain 2002  . DJD (degenerative joint disease) of cervical spine 2002    MRI Cspine  . Echocardiogram shows left ventricular diastolic dysfunction 2002    And (-) stress ECHO (diastolic dysfunction later improved)  . Pneumonia March 2013    PHYSICAL EXAM Filed Vitals:   05/07/11 1010 05/07/11 1400 05/07/11 2100 05/08/11 0500  BP: 103/55 98/58 107/55 115/63  Pulse: 60 62 67 71  Temp:  99.6 F (37.6 C) 98.7 F (37.1 C) 97.4 F (36.3 C)  TempSrc:  Oral Oral Oral  Resp:  20 18 18   Height:      Weight:    97 lb (43.999 kg)  SpO2:  98% 94% 94%    Well developed and nourished in no acute distress HENT normal Neck supple with JVP-8-10 clear Regular rate and rhythm, no murmurs or gallops Abd-soft with active BS No Clubbing cyanosis edema Skin-warm and dry A & Oriented  Grossly normal sensory and motor function   TELEMETRY: Reviewed telemetry pt in nsr    Intake/Output Summary (Last 24 hours) at 05/08/11 0843 Last data filed at 05/08/11 0500  Gross per 24 hour  Intake    240 ml  Output      0 ml  Net    240 ml    LABS: Basic Metabolic Panel:  Lab 05/06/11 2952 05/03/11 1843  NA 135 140  K 3.8 3.6  CL 102 103  CO2 25 26  GLUCOSE 116* 94  BUN 12 17  CREATININE 0.69 0.69  CALCIUM 9.7 10.1  MG -- 2.0  PHOS -- --   Cardiac  Enzymes: No results found for this basename: CKTOTAL:3,CKMB:3,CKMBINDEX:3,TROPONINI:3 in the last 72 hours CBC:  Lab 05/07/11 0635 05/06/11 0635 05/04/11 0343 05/03/11 1843  WBC 7.6 9.0 7.7 7.9  NEUTROABS -- -- -- 6.1  HGB 10.2* 11.3* 11.4* 11.9*  HCT 30.2* 33.4* 33.2* 34.5*  MCV 88.6 88.8 86.5 88.5  PLT 245 236 228 249  Anemia Panel: No results found for this basename: VITAMINB12,FOLATE,FERRITIN,TIBC,IRON,RETICCTPCT in the last 72 hours   D*   ASSESSMENT AND PLAN:  Patient Active Hospital Problem List: HYPERLIPIDEMIA (07/28/2006)   HYPERTENSION (07/28/2006)   Sinoatrial node dysfunction (07/02/2008)   NSTEMI (non-ST elevated myocardial infarction) (05/04/2011)  *severe 3V CAD For now will continue current meds casemagr consultant Cardiac rehab Consider ranexa as BP lay be limiting LMWH full dose while in hospital  Decrease amlopidine and increase imdur     Signed, Sherryl Manges MD  05/08/2011

## 2011-05-08 NOTE — Progress Notes (Signed)
   CARE MANAGEMENT NOTE 05/08/2011  Patient:  Lori Trujillo, Lori Trujillo   Account Number:  [id number]  Date Initiated:  05/08/2011  Documentation initiated by:  GRAVES-BIGELOW,Montina Dorrance  Subjective/Objective Assessment:   Pt admitted with Nstemi. Pt has good family support and she lives at the [facility] - per pt it is Independent Living. Pt states she goes to the [facility] 5 days a week.     Action/Plan:   No needs from CM at this time.   Anticipated DC Date:  05/09/2011   Anticipated DC Plan:  ASSISTED LIVING / REST HOME      DC Planning Services  CM consult      Choice offered to / List presented to:             Status of service:  Completed, signed off Medicare Important Message given?   (If response is "NO", the following Medicare IM given date fields will be blank) Date Medicare IM given:   Date Additional Medicare IM given:    Discharge Disposition:  ASSISTED LIVING  Per UR Regulation:    If discussed at Long Length of Stay Meetings, dates discussed:    Comments:

## 2011-05-08 NOTE — Progress Notes (Signed)
UR Completed. Simmons, Deshon Koslowski F [phone]  

## 2011-05-08 NOTE — Progress Notes (Signed)
CARDIAC REHAB PHASE I   PRE:  Rate/Rhythm: 68 Pacing  BP:  Supine: 100/50  Sitting:   Standing:    SaO2: 98 RA  MODE:  Ambulation: 460 ft   POST:  Rate/Rhythem: 84  BP:  Supine:   Sitting: 120/50  Standing:    SaO2: 95 RA 0950-1105 Assisted X 1 and used her cane to ambulate. Gait steady VS stable. No c/o of cp or SOB with walking. Completed discharge education with pt. She agrees to [employer]. [facility] in [place], will send referral.  Beatrix Fetters

## 2011-05-09 LAB — URINALYSIS, ROUTINE W REFLEX MICROSCOPIC
Bilirubin Urine: NEGATIVE
Hgb urine dipstick: NEGATIVE
Nitrite: NEGATIVE
Specific Gravity, Urine: 1.011 (ref 1.005–1.030)
Urobilinogen, UA: 1 mg/dL (ref 0.0–1.0)
pH: 6.5 (ref 5.0–8.0)

## 2011-05-09 LAB — CBC
HCT: 32.3 % — ABNORMAL LOW (ref 36.0–46.0)
MCV: 88.7 fL (ref 78.0–100.0)
RBC: 3.64 MIL/uL — ABNORMAL LOW (ref 3.87–5.11)
RDW: 14.9 % (ref 11.5–15.5)
WBC: 5.1 10*3/uL (ref 4.0–10.5)

## 2011-05-09 NOTE — Discharge Instructions (Addendum)
NO HEAVY LIFTING OR SEXUAL ACTIVITY X 7 DAYS. NO DRIVING X 2 DAYS. NO SOAKING BATHS, HOT TUBS, POOLS, ETC., X 5 DAYS.  Home Health  Services arranged with [facility]. [phone].  1. Registered Nurse

## 2011-05-09 NOTE — Progress Notes (Signed)
CARDIAC REHAB PHASE I   PRE:  Rate/Rhythm: 61 Pacing  BP:  Supine: 90/50  Sitting:   Standing:    SaO2: 97 RA  MODE:  Ambulation: 460 ft   POST:  Rate/Rhythem: 69  BP:  Supine:   Sitting: 130/60  Standing:    SaO2: 95 RA [id number] Assisted X 1 and pt used her cane to ambulate. Gait steady with cane. Able to walk 460 feet without c/o of cp or SOB. VS stable. Back to bed after walk per pt's request with call light in reach and granddaughter present.  Lori Trujillo

## 2011-05-09 NOTE — Care Management Note (Signed)
Care Manager spoke to son Minerva Areola this am and son would like to meet with CM at 3:00pm to discuss options of [facility] services along with personal care services. Will address home needs @ that time. Thanks Gala Lewandowsky, RN,BSN [phone]

## 2011-05-09 NOTE — Progress Notes (Signed)
  Patient Name: Lori Trujillo      SUBJECTIVE: Cath results noted Felt not to be CABG candidate nor likely PCI   NO chest heaviness yesterday walked 460 ft C/o urinary urgency---not common Not clarifying conversations with case mgmnt     Past Medical History  Diagnosis Date  . Hyperlipidemia   . Hypertension   . Hypothyroidism 6/11    Thyroid ablation, s/p treatment with iodine  . Tachy-brady syndrome 2003    Per holter, s/p pacemaker implantation in [place] (pacemaker replaced 11/11)  . Hypercalcemia   . TIA (transient ischemic attack) 2000    ?TIA; carotid u/s 1-15% and brain MRI lacunar dz  . Chronic neck pain 2002  . DJD (degenerative joint disease) of cervical spine 2002    MRI Cspine  . Echocardiogram shows left ventricular diastolic dysfunction 2002    And (-) stress ECHO (diastolic dysfunction later improved)  . Pneumonia March 2013    PHYSICAL EXAM Filed Vitals:   05/08/11 0909 05/08/11 1400 05/08/11 2100 05/09/11 0500  BP: 112/52 92/48 113/59 123/67  Pulse: 74 72 67 64  Temp:  98.1 F (36.7 C) 97.6 F (36.4 C) 97.8 F (36.6 C)  TempSrc:  Oral Oral Oral  Resp:  20 18 18   Height:      Weight:    100 lb 8 oz (45.587 kg)  SpO2:  95% 98% 97%    Well developed and nourished in no acute distress HENT normal Neck supple with JVP-flat occ crackles Regular rate and rhythm, no murmurs or gallops Abd-soft with active BS No Clubbing cyanosis edema Skin-warm and dry A & Oriented  Grossly normal sensory and motor function   TELEMETRY: Reviewed telemetry pt in nsr   No intake or output data in the 24 hours ending 05/09/11 0824  LABS: Basic Metabolic Panel:  Lab 05/08/11 4098 05/06/11 0635 05/03/11 1843  NA 138 135 140  K 4.5 3.8 3.6  CL 101 102 103  CO2 28 25 26   GLUCOSE 89 116* 94  BUN 13 12 17   CREATININE 0.67 0.69 0.69  CALCIUM 10.2 9.7 --  MG -- -- 2.0  PHOS -- -- --   Cardiac Enzymes: No results found for this basename:  CKTOTAL:3,CKMB:3,CKMBINDEX:3,TROPONINI:3 in the last 72 hours CBC:  Lab 05/09/11 0510 05/08/11 1103 05/07/11 0635 05/06/11 0635 05/04/11 0343 05/03/11 1843  WBC 5.1 6.3 7.6 9.0 7.7 7.9  NEUTROABS -- -- -- -- -- 6.1  HGB 10.7* 11.2* 10.2* 11.3* 11.4* 11.9*  HCT 32.3* 33.5* 30.2* 33.4* 33.2* 34.5*  MCV 88.7 88.9 88.6 88.8 86.5 88.5  PLT 271 278 245 236 228 249  Anemia Panel: No results found for this basename: VITAMINB12,FOLATE,FERRITIN,TIBC,IRON,RETICCTPCT in the last 72 hours       ASSESSMENT AND PLAN:  ,hmpro Patient Active Hospital Problem List:     NSTEMI (non-ST elevated myocardial infarction) (05/04/2011)   Ischemic cardiomyopathy  CHF- chronic systolic-  Urinary urgency   Plan 1) case management consultation--needs clarity 2) anticipate d/c today or tomorrow once #1 resolved  On current meds incl diuretic 3) UA today  And rx as necesary      Signed, Sherryl Manges MD  05/09/2011

## 2011-05-09 NOTE — Progress Notes (Signed)
   CARE MANAGEMENT NOTE 05/09/2011  Patient:  Lori Trujillo, Lori Trujillo   Account Number:  [id number]  Date Initiated:  05/08/2011  Documentation initiated by:  GRAVES-BIGELOW,Marabelle Cushman  Subjective/Objective Assessment:   Pt admitted with Nstemi. Pt has good family support and she lives at the [facility] - per pt it is Independent Living. Pt states she goes to the [employer] 5 days a week.     Action/Plan:   No needs from CM at this time.   Anticipated DC Date:  05/09/2011   Anticipated DC Plan:  ASSISTED LIVING / REST HOME      DC Planning Services  CM consult      [facility] Choice  HOME HEALTH   Choice offered to / List presented to:  C-1 Patient        HH arranged  HH-1 RN  HH-10 DISEASE MANAGEMENT      HH agency  [facility]   Status of service:  Completed, signed off Medicare Important Message given?   (If response is "NO", the following Medicare IM given date fields will be blank) Date Medicare IM given:   Date Additional Medicare IM given:    Discharge Disposition:  ASSISTED LIVING  Per UR Regulation:    If discussed at Long Length of Stay Meetings, dates discussed:    Comments:  05-09-11 1551 Tomi Bamberger, RN,BSN [phone] CM did have a family meeting with son and daughter and they would like for mother to have a HHRN for v/s monitoring, medication management/ disease management. Pt chose t o use [facility] for services. CM did give pt information on the life alert system and a list of personal care agencies for aide assistance. CM did relay to RN that an order is needed for above services. CM did make referral with [facility] for services and SOC to begin within 24-48 hours post d/c. No other needs for CM at this time.

## 2011-05-10 ENCOUNTER — Encounter (HOSPITAL_COMMUNITY): Payer: Self-pay | Admitting: Physician Assistant

## 2011-05-10 LAB — CBC
Hemoglobin: 10.8 g/dL — ABNORMAL LOW (ref 12.0–15.0)
Platelets: 282 10*3/uL (ref 150–400)
RBC: 3.64 MIL/uL — ABNORMAL LOW (ref 3.87–5.11)
WBC: 4.9 10*3/uL (ref 4.0–10.5)

## 2011-05-10 MED ORDER — ASPIRIN EC 81 MG PO TBEC: 81.0000 mg | DELAYED_RELEASE_TABLET | Freq: Every day | ORAL | Status: AC

## 2011-05-10 MED ORDER — METOPROLOL TARTRATE 25 MG PO TABS: 37.5000 mg | ORAL_TABLET | Freq: Two times a day (BID) | ORAL | Status: DC

## 2011-05-10 MED ORDER — AMLODIPINE BESYLATE 2.5 MG PO TABS: 2.5000 mg | ORAL_TABLET | Freq: Every day | ORAL | Status: DC

## 2011-05-10 MED ORDER — ISOSORBIDE MONONITRATE ER 60 MG PO TB24: 90.0000 mg | ORAL_TABLET | Freq: Every day | ORAL | Status: DC

## 2011-05-10 MED ORDER — CLOPIDOGREL BISULFATE 75 MG PO TABS: 75.0000 mg | ORAL_TABLET | Freq: Every day | ORAL | Status: DC

## 2011-05-10 NOTE — Progress Notes (Signed)
   SUBJECTIVE:  Mild chest pain this am.  No SOB   PHYSICAL EXAM Filed Vitals:   05/09/11 1004 05/09/11 1400 05/09/11 2100 05/10/11 0554  BP: 118/64 102/62 137/76 121/67  Pulse: 67 61 62 62  Temp:  97.5 F (36.4 C) 97.7 F (36.5 C) 97.6 F (36.4 C)  TempSrc:   Oral Oral  Resp:  18 18 18   Height:      Weight:    106 lb 4.8 oz (48.217 kg)  SpO2:  98% 97% 97%   General:  No distress Lungs:  Clear Heart:  RRR Abdomen:  Positive bowel sounds, no rebound no guarding Extremities:  No edema.  LABS: Lab Results  Component Value Date   CKTOTAL 245* 05/04/2011   CKMB 10.6* 05/04/2011   TROPONINI 9.39* 05/04/2011   Results for orders placed during the hospital encounter of 05/03/11 (from the past 24 hour(s))  URINALYSIS, ROUTINE W REFLEX MICROSCOPIC     Status: Normal   Collection Time   05/09/11 10:36 AM      Component Value Range   Color, Urine YELLOW  YELLOW    APPearance CLEAR  CLEAR    Specific Gravity, Urine 1.011  1.005 - 1.030    pH 6.5  5.0 - 8.0    Glucose, UA NEGATIVE  NEGATIVE (mg/dL)   Hgb urine dipstick NEGATIVE  NEGATIVE    Bilirubin Urine NEGATIVE  NEGATIVE    Ketones, ur NEGATIVE  NEGATIVE (mg/dL)   Protein, ur NEGATIVE  NEGATIVE (mg/dL)   Urobilinogen, UA 1.0  0.0 - 1.0 (mg/dL)   Nitrite NEGATIVE  NEGATIVE    Leukocytes, UA NEGATIVE  NEGATIVE   CBC     Status: Abnormal   Collection Time   05/10/11  5:45 AM      Component Value Range   WBC 4.9  4.0 - 10.5 (K/uL)   RBC 3.64 (*) 3.87 - 5.11 (MIL/uL)   Hemoglobin 10.8 (*) 12.0 - 15.0 (g/dL)   HCT 16.1 (*) 09.6 - 46.0 (%)   MCV 88.7  78.0 - 100.0 (fL)   MCH 29.7  26.0 - 34.0 (pg)   MCHC 33.4  30.0 - 36.0 (g/dL)   RDW 04.5  40.9 - 81.1 (%)   Platelets 282  150 - 400 (K/uL)    Intake/Output Summary (Last 24 hours) at 05/10/11 0818 Last data filed at 05/09/11 0945  Gross per 24 hour  Intake    240 ml  Output      0 ml  Net    240 ml   ASSESSMENT AND PLAN:  1) NSTEMI (non-ST elevated myocardial  infarction):  Medical management.    2) HYPERLIPIDEMIA:  She is not on a statin.  I see an "allergy" to Zocor and I suspect a statin intolerance.  This should be explored further at follow up with Dr. Drue Novel and Graciela Husbands  3) HYPERTENSION:  BP well controlled.  4) Sinoatrial node dysfunction:  Pacemaker follow up to date.  5) Disposition:  OK to discharge as it looks like the case manager and family meeting is complete.  She will need an order for home health.  Meds as on MAR.  Follow two weeks in our office.   Fayrene Fearing [facility] 05/10/2011 8:18 AM

## 2011-05-10 NOTE — Discharge Summary (Addendum)
CARDIOLOGY DISCHARGE SUMMARY   Patient ID: Lori Trujillo MRN: [id number] DOB/AGE: 04/05/1921 78 y.o.  Admit date: 05/03/2011 Discharge date: 05/10/2011  Primary Discharge Diagnosis:  NSTEMI Secondary Discharge Diagnosis:  Past Medical History  Diagnosis Date  . Hyperlipidemia   . Hypertension   . Hypothyroidism 6/11    Thyroid ablation, s/p treatment with iodine  . Tachy-brady syndrome 2003    Per holter, s/p pacemaker implantation in [place] (pacemaker replaced 11/11 - [employer])  . Hypercalcemia   . TIA (transient ischemic attack) 2000    ?TIA; carotid u/s 1-15% and brain MRI lacunar dz  . Chronic neck pain 2002  . DJD (degenerative joint disease) of cervical spine 2002    MRI Cspine  . Echocardiogram shows left ventricular diastolic dysfunction 2002    And (-) stress ECHO (diastolic dysfunction later improved)  . Pneumonia March 2013   Procedures: Left Heart Cath, Selective Coronary Angiography, LV angiography, [employer] PPM Interrogation,   Hospital Course: Lori Trujillo is an 78 year old female with no previous history of coronary artery disease. She was seen in the office for chest pain. She had a Myoview scan that was markedly abnormal. She was having epigastric discomfort provoked by exertion that was concerning for angina and was admitted for further evaluation and treatment.  She was medically treated for angina with aspirin, heparin and nitrates. Her enzymes elevated indicating a non-ST segment elevation MI. She was started on a beta blocker but no statin because of the reported intolerance to simvastatin. If she is stable as an outpatient, perhaps another statin can be considered at a low dose.   On 05/06/2011 she was taken to the cath lab. The cardiac catheterization results and recommendations are listed below. Medical therapy is felt to be the best option at this time. She was started on isosorbide and the dose was increased for better anginal control. Case management was  contacted and Lori Trujillo had been in the independent living prior to admission. She was seen by cardiac rehabilitation and was ambulating with a cane. She did not have chest pain or shortness of breath with ambulation. She was felt appropriate to return to independent living.  On 05/10/2011, Lori Trujillo was seen by Dr. Antoine Poche. She was ambulating without chest pain or shortness of breath and considered stable for discharge, to follow up as an outpatient.  An ACE/ARB was not added to her meds as she had not tolerated them in the past due to side-effects.  Labs:  Lab Results  Component Value Date   WBC 4.9 05/10/2011   HGB 10.8* 05/10/2011   HCT 32.3* 05/10/2011   MCV 88.7 05/10/2011   PLT 282 05/10/2011    Lab 05/08/11 1103 05/03/11 1843  NA 138 --  K 4.5 --  CL 101 --  CO2 28 --  BUN 13 --  CREATININE 0.67 --  CALCIUM 10.2 --  PROT -- 6.8  BILITOT -- 0.5  ALKPHOS -- 76  ALT -- 13  AST -- 54*  GLUCOSE 89 --   Lab Results  Component Value Date   CKTOTAL 245* 05/04/2011   CKMB 10.6* 05/04/2011   TROPONINI 9.39* 05/04/2011    Lipid Panel     Component Value Date/Time   CHOL 218* 05/04/2011 0344   TRIG 46 05/04/2011 0344   HDL 109 05/04/2011 0344   CHOLHDL 2.0 05/04/2011 0344   VLDL 9 05/04/2011 0344   LDLCALC 100* 05/04/2011 0344   Cardiac Cath: 4/15 Procedural Findings:  Hemodynamics:  AO  126/49 with a mean of 78  LV 125/14  Coronary dominance: right  Left mainstem: the left main is severely calcified. There is severe distal left main disease of 90%.  Left anterior descending (LAD): the LAD has critical, heavily calcified ostial stenosis. There is slow flow into the first diagonal branch and competitive flow into the mid LAD. The mid and distal LAD fill competitively from right to left collaterals.  Left circumflex (LCx): the left circumflex also has subtotal occlusion with severe 95% calcification involving the distal left mainstem. The vessel gives off 2 tiny OM branches and a  moderate caliber third OM with 60% proximal stenosis.  Right coronary artery (RCA): the RCA is severely calcified. The proximal vessel has 70% stenosis. The mid vessel is patent. The distal vessel has 50% stenosis. The PDA and posterolateral branches are diffusely diseased but patent.  Left ventriculography:there is severe hypokinesis of the distal anterior wall and akinesis of the apex. The basal inferior and basal anterior walls contract normally. The left ventricular ejection fraction is estimated at 35%.  Final Conclusions:  1. Critical distal left main disease extending into the LAD and left circumflex with severe calcification  2. Total occlusion of the LAD with right to left collaterals  3. Moderate diffuse right coronary artery stenosis  4. Severe segmental left ventricular systolic dysfunction  Recommendations: unfortunately, I don't think this frail 78 year old woman has reasonable revascularization options. She is certainly not a candidate for coronary bypass surgery. Her coronary anatomy would be extremely high risk for any type of PCI. The only potentially feasible option would involve rotational atherectomy of the distal left main into the circumflex which would carry very high risk. I would favor medical therapy unless she has refractory angina with recurrent non-ST elevation infarction and in that setting high-risk PCI could be considered. I will start her on Plavix.   EKG:05-May-2011 02:42:32  Electronic atrial pacemaker Left ventricular hypertrophy with repolarization abnormality Cannot rule out Septal infarct , age undetermined Abnormal ECG 39mm/s 22mm/mV 100Hz  8.0.1 12SL 235 CID: [id number] Referred by: MD STEVEN C KLEIN Unconfirmed Vent. rate 63 BPM PR interval 120 Lori QRS duration 102 Lori QT/QTc 426/435 Lori P-R-T axes 38 3 161   FOLLOW UP PLANS AND APPOINTMENTS Discharge Orders    Future Appointments: Provider: Department: Dept Phone: Center:   05/22/2011 10:45 AM Duke Salvia,  MD [facility] : [facility] [phone] [facility]   08/08/2011 8:55 AM [facility] [phone] [facility]     Future Orders Please Complete By Expires   Amb Referral to Cardiac Rehabilitation        Allergies  Allergen Reactions  . Simvastatin     unknown   Medication List  As of 05/10/2011 11:15 AM   TAKE these medications         amLODipine 2.5 MG tablet   Commonly known as: NORVASC   Take 1 tablet (2.5 mg total) by mouth daily.      aspirin EC 81 MG tablet   Take 1 tablet (81 mg total) by mouth daily.      cholecalciferol 1000 UNITS tablet   Commonly known as: VITAMIN D   Take 1,000 Units by mouth daily.      clopidogrel 75 MG tablet   Commonly known as: PLAVIX   Take 1 tablet (75 mg total) by mouth daily with breakfast.      docusate sodium 100 MG capsule   Commonly known as: COLACE   Take 100 mg  by mouth as needed. Soft stool      hydrochlorothiazide 25 MG tablet   Commonly known as: HYDRODIURIL   Take 12.5 mg by mouth daily.      isosorbide mononitrate 60 MG 24 hr tablet   Commonly known as: IMDUR   Take 1.5 tablets (90 mg total) by mouth daily.      levothyroxine 100 MCG tablet   Commonly known as: SYNTHROID, LEVOTHROID   TAKE ONE TABLET BY MOUTH EVERY DAY      metoprolol tartrate 25 MG tablet   Commonly known as: LOPRESSOR   Take 1.5 tablets (37.5 mg total) by mouth 2 (two) times daily.      nitroGLYCERIN 0.4 MG SL tablet   Commonly known as: NITROSTAT   Place 0.4 mg under the tongue every 5 (five) minutes as needed. For chest pain           Follow-up Information    Follow up with Sherryl Manges, MD. (May 1st at 10:45 am)    Contact information:   [address], [address] [place] [address] [phone]          BRING ALL MEDICATIONS WITH YOU TO FOLLOW UP APPOINTMENTS  Time spent with patient to include physician time: 36 min Signed: Theodore Demark 05/10/2011,  11:15 AM Co-Sign MD  Patient seen and examined.  Plan as discussed in my rounding note for today and outlined above. Rollene Rotunda  05/10/2011  1:54 PM

## 2011-05-22 ENCOUNTER — Ambulatory Visit (INDEPENDENT_AMBULATORY_CARE_PROVIDER_SITE_OTHER): Payer: Medicare Other | Admitting: Internal Medicine

## 2011-05-22 ENCOUNTER — Encounter: Payer: Self-pay | Admitting: Internal Medicine

## 2011-05-22 VITALS — BP 142/70 | HR 70 | Ht 62.5 in | Wt 100.0 lb

## 2011-05-22 DIAGNOSIS — I2510 Atherosclerotic heart disease of native coronary artery without angina pectoris: Secondary | ICD-10-CM

## 2011-05-22 DIAGNOSIS — Z950 Presence of cardiac pacemaker: Secondary | ICD-10-CM

## 2011-05-22 DIAGNOSIS — I2589 Other forms of chronic ischemic heart disease: Secondary | ICD-10-CM

## 2011-05-22 DIAGNOSIS — I255 Ischemic cardiomyopathy: Secondary | ICD-10-CM

## 2011-05-22 MED ORDER — ISOSORBIDE MONONITRATE ER 120 MG PO TB24: 120.0000 mg | ORAL_TABLET | Freq: Every day | ORAL | Status: DC

## 2011-05-22 NOTE — Assessment & Plan Note (Signed)
Relatively well compensated. We'll increase her IMDUR

## 2011-05-22 NOTE — Assessment & Plan Note (Signed)
We have reviewed her cath results and the implications regarding prognosis as well as the almost certain recurrence of exertional chest discomfort. We discussed the role of prophylactic nitroglycerin; we will also increase her baseline glucose of 90-120 mg daily.  She will tolerate further up titration of her beta blocker; we will have her seen again in 6 weeks' time and at dose adjustment can be made.  As noted we reviewed the potential limitations associated with her severe ischemic myopathy and the likelihood that it is a pre-fatal condition

## 2011-05-22 NOTE — Patient Instructions (Signed)
Your physician has recommended you make the following change in your medication:  1) Start Crestor 5 mg take one tablet by mouth 2 times a week. 2) Increase Imdur (isosorbide mononitrate) to 120 mg once daily.  Your physician recommends that you schedule a follow-up appointment in: 6 weeks with Tereso Newcomer, PA & in 3 months with Dr. Graciela Husbands.

## 2011-05-22 NOTE — Progress Notes (Signed)
HPI  Lori Trujillo is a 78 y.o. female Following her recent hospitalization at which time she underwent catheterization demonstrating three-vessel disease.:: Critical distal left main disease extending into the LAD and left circumflex with severe calcification  2. Total occlusion of the LAD with right to left collaterals  3. Moderate diffuse right coronary artery stenosis  4. Severe segmental left ventricular systolic dysfunction This followed a markedly abnormal Myoview undertaken for chest pain.  She had a concurrent non-STEMI.  It was elected to treat her medically. She has done relatively well with only occasional exertional chest tightness. Her breathing is stable Past Medical History  Diagnosis Date  . Hyperlipidemia   . Hypertension   . Hypothyroidism 6/11    Thyroid ablation, s/p treatment with iodine  . Tachy-brady syndrome 2003    Per holter, s/p pacemaker implantation in [place] (pacemaker replaced 11/11 with [employer] Device)  . Hypercalcemia   . TIA (transient ischemic attack) 2000    ?TIA; carotid u/s 1-15% and brain MRI lacunar dz  . Chronic neck pain 2002  . DJD (degenerative joint disease) of cervical spine 2002    MRI Cspine  . Echocardiogram shows left ventricular diastolic dysfunction 2002    And (-) stress ECHO (diastolic dysfunction later improved)  . Pneumonia March 2013    Past Surgical History  Procedure Date  . Abdominal hysterectomy   . Tonsillectomy     Current Outpatient Prescriptions  Medication Sig Dispense Refill  . amLODipine (NORVASC) 2.5 MG tablet Take 1 tablet (2.5 mg total) by mouth daily.  30 tablet  11  . aspirin EC 81 MG tablet Take 1 tablet (81 mg total) by mouth daily.  30 tablet    . cholecalciferol (VITAMIN D) 1000 UNITS tablet Take 1,000 Units by mouth daily.        . clopidogrel (PLAVIX) 75 MG tablet Take 1 tablet (75 mg total) by mouth daily with breakfast.  30 tablet  11  . docusate sodium (COLACE) 100 MG capsule Take 100 mg by  mouth as needed. Soft stool      . hydrochlorothiazide (HYDRODIURIL) 25 MG tablet Take 12.5 mg by mouth daily.      . isosorbide mononitrate (IMDUR) 60 MG 24 hr tablet Take 1.5 tablets (90 mg total) by mouth daily.  45 tablet  11  . levothyroxine (SYNTHROID, LEVOTHROID) 100 MCG tablet TAKE ONE TABLET BY MOUTH EVERY DAY  30 tablet  6  . metoprolol tartrate (LOPRESSOR) 25 MG tablet Take 1.5 tablets (37.5 mg total) by mouth 2 (two) times daily.  90 tablet  11  . nitroGLYCERIN (NITROSTAT) 0.4 MG SL tablet Place 0.4 mg under the tongue every 5 (five) minutes as needed. For chest pain      . DISCONTD: benazepril (LOTENSIN) 40 MG tablet Take 1 tablet (40 mg total) by mouth daily.  30 tablet  6    Allergies  Allergen Reactions  . Simvastatin     unknown    Review of Systems negative except from HPI and PMH  Physical Exam BP 142/70  Pulse 70  Ht 5' 2.5" (1.588 m)  Wt 100 lb (45.36 kg)  BMI 18.00 kg/m2 Well developed and well nourished in no acute distress HENT normal E scleral and icterus clear Neck Supple JVP flat; carotids brisk and full Clear to ausculation Regular rate and rhythm, no murmurs +s4 Soft with active bowel sounds No clubbing cyanosis none Edema Alert and oriented, grossly normal motor and sensory function Skin Warm and  Dry  Atrial pacing at 70 Left ventricular hypertrophy with repolarization abnormalities  Assessment and  Plan

## 2011-05-22 NOTE — Assessment & Plan Note (Signed)
The patient's device was interrogated.  The information was reviewed. No changes were made in the programming.    

## 2011-05-28 ENCOUNTER — Telehealth: Payer: Self-pay | Admitting: Internal Medicine

## 2011-05-28 NOTE — Telephone Encounter (Deleted)
Error

## 2011-06-21 ENCOUNTER — Telehealth: Payer: Self-pay | Admitting: *Deleted

## 2011-06-21 NOTE — Telephone Encounter (Signed)
Pt stated that someone called her from this office yesterday, noted no phone note, no refferals pending, no recent labs, pt stated that her phone acts weird sometimes and it may be an old call, transferred to scheduler successfully per wanted to make a follow up for 3 months

## 2011-06-28 ENCOUNTER — Inpatient Hospital Stay (HOSPITAL_COMMUNITY)
Admission: EM | Admit: 2011-06-28 | Discharge: 2011-07-04 | DRG: 280 | Disposition: A | Discharge status: Skilled Nursing Facility | Payer: Medicare Other | Attending: Cardiovascular Disease | Admitting: Cardiovascular Disease

## 2011-06-28 ENCOUNTER — Other Ambulatory Visit: Payer: Self-pay

## 2011-06-28 ENCOUNTER — Emergency Department (HOSPITAL_COMMUNITY): Payer: Medicare Other

## 2011-06-28 ENCOUNTER — Encounter (HOSPITAL_COMMUNITY): Payer: Self-pay

## 2011-06-28 DIAGNOSIS — I10 Essential (primary) hypertension: Secondary | ICD-10-CM | POA: Insufficient documentation

## 2011-06-28 DIAGNOSIS — R5381 Other malaise: Secondary | ICD-10-CM

## 2011-06-28 DIAGNOSIS — L989 Disorder of the skin and subcutaneous tissue, unspecified: Secondary | ICD-10-CM

## 2011-06-28 DIAGNOSIS — E042 Nontoxic multinodular goiter: Secondary | ICD-10-CM

## 2011-06-28 DIAGNOSIS — E039 Hypothyroidism, unspecified: Secondary | ICD-10-CM | POA: Insufficient documentation

## 2011-06-28 DIAGNOSIS — I495 Sick sinus syndrome: Secondary | ICD-10-CM

## 2011-06-28 DIAGNOSIS — I5023 Acute on chronic systolic (congestive) heart failure: Secondary | ICD-10-CM

## 2011-06-28 DIAGNOSIS — Z7902 Long term (current) use of antithrombotics/antiplatelets: Secondary | ICD-10-CM

## 2011-06-28 DIAGNOSIS — Z66 Do not resuscitate: Secondary | ICD-10-CM | POA: Diagnosis present

## 2011-06-28 DIAGNOSIS — I255 Ischemic cardiomyopathy: Secondary | ICD-10-CM | POA: Insufficient documentation

## 2011-06-28 DIAGNOSIS — J189 Pneumonia, unspecified organism: Secondary | ICD-10-CM

## 2011-06-28 DIAGNOSIS — M47812 Spondylosis without myelopathy or radiculopathy, cervical region: Secondary | ICD-10-CM | POA: Diagnosis present

## 2011-06-28 DIAGNOSIS — Z87891 Personal history of nicotine dependence: Secondary | ICD-10-CM

## 2011-06-28 DIAGNOSIS — I509 Heart failure, unspecified: Secondary | ICD-10-CM

## 2011-06-28 DIAGNOSIS — Z8249 Family history of ischemic heart disease and other diseases of the circulatory system: Secondary | ICD-10-CM

## 2011-06-28 DIAGNOSIS — J449 Chronic obstructive pulmonary disease, unspecified: Secondary | ICD-10-CM

## 2011-06-28 DIAGNOSIS — I2589 Other forms of chronic ischemic heart disease: Secondary | ICD-10-CM | POA: Diagnosis present

## 2011-06-28 DIAGNOSIS — E785 Hyperlipidemia, unspecified: Secondary | ICD-10-CM | POA: Insufficient documentation

## 2011-06-28 DIAGNOSIS — Z7982 Long term (current) use of aspirin: Secondary | ICD-10-CM

## 2011-06-28 DIAGNOSIS — I214 Non-ST elevation (NSTEMI) myocardial infarction: Principal | ICD-10-CM

## 2011-06-28 DIAGNOSIS — Z950 Presence of cardiac pacemaker: Secondary | ICD-10-CM

## 2011-06-28 DIAGNOSIS — I5022 Chronic systolic (congestive) heart failure: Secondary | ICD-10-CM

## 2011-06-28 DIAGNOSIS — J811 Chronic pulmonary edema: Secondary | ICD-10-CM

## 2011-06-28 DIAGNOSIS — I2510 Atherosclerotic heart disease of native coronary artery without angina pectoris: Secondary | ICD-10-CM | POA: Insufficient documentation

## 2011-06-28 DIAGNOSIS — R9431 Abnormal electrocardiogram [ECG] [EKG]: Secondary | ICD-10-CM

## 2011-06-28 DIAGNOSIS — R000 Tachycardia, unspecified: Secondary | ICD-10-CM | POA: Diagnosis present

## 2011-06-28 DIAGNOSIS — Z8673 Personal history of transient ischemic attack (TIA), and cerebral infarction without residual deficits: Secondary | ICD-10-CM

## 2011-06-28 HISTORY — DX: Other specified health status: Z78.9

## 2011-06-28 HISTORY — DX: Atherosclerotic heart disease of native coronary artery without angina pectoris: I25.10

## 2011-06-28 HISTORY — DX: Ischemic cardiomyopathy: I25.5

## 2011-06-28 LAB — MRSA PCR SCREENING: MRSA by PCR: NEGATIVE

## 2011-06-28 LAB — DIFFERENTIAL
Basophils Absolute: 0 10*3/uL (ref 0.0–0.1)
Eosinophils Absolute: 0 10*3/uL (ref 0.0–0.7)
Eosinophils Relative: 0 % (ref 0–5)
Lymphocytes Relative: 24 % (ref 12–46)
Lymphs Abs: 2.6 10*3/uL (ref 0.7–4.0)
Monocytes Absolute: 0.7 10*3/uL (ref 0.1–1.0)

## 2011-06-28 LAB — BASIC METABOLIC PANEL
CO2: 21 mEq/L (ref 19–32)
Calcium: 10.4 mg/dL (ref 8.4–10.5)
Creatinine, Ser: 0.88 mg/dL (ref 0.50–1.10)
GFR calc non Af Amer: 57 mL/min — ABNORMAL LOW (ref >90)
Glucose, Bld: 189 mg/dL — ABNORMAL HIGH (ref 70–99)
Sodium: 137 mEq/L (ref 135–145)

## 2011-06-28 LAB — CARDIAC PANEL(CRET KIN+CKTOT+MB+TROPI)
Relative Index: 8.6 — ABNORMAL HIGH (ref 0.0–2.5)
Total CK: 456 U/L — ABNORMAL HIGH (ref 7–177)

## 2011-06-28 LAB — CBC
HCT: 39.8 % (ref 36.0–46.0)
MCH: 30.4 pg (ref 26.0–34.0)
MCV: 88.4 fL (ref 78.0–100.0)
Platelets: 233 10*3/uL (ref 150–400)
RDW: 15.9 % — ABNORMAL HIGH (ref 11.5–15.5)

## 2011-06-28 LAB — PRO B NATRIURETIC PEPTIDE: Pro B Natriuretic peptide (BNP): 4663 pg/mL — ABNORMAL HIGH (ref 0–450)

## 2011-06-28 MED ORDER — VITAMIN D3 25 MCG (1000 UNIT) PO TABS
1000.0000 [IU] | ORAL_TABLET | Freq: Every day | ORAL | Status: DC
  Administered 2011-06-28 – 2011-07-04 (×7): 1000 [IU] via ORAL
  Filled 2011-06-28 (×7): qty 1

## 2011-06-28 MED ORDER — NITROGLYCERIN IN D5W 200-5 MCG/ML-% IV SOLN
3.0000 ug/min | INTRAVENOUS | Status: DC
  Administered 2011-06-28: 5 ug/min via INTRAVENOUS
  Filled 2011-06-28: qty 250

## 2011-06-28 MED ORDER — ATORVASTATIN CALCIUM 10 MG PO TABS
10.0000 mg | ORAL_TABLET | Freq: Every day | ORAL | Status: DC
  Administered 2011-06-29 – 2011-07-03 (×5): 10 mg via ORAL
  Filled 2011-06-28 (×6): qty 1

## 2011-06-28 MED ORDER — ASPIRIN EC 81 MG PO TBEC
81.0000 mg | DELAYED_RELEASE_TABLET | Freq: Every day | ORAL | Status: DC
  Administered 2011-06-29 – 2011-07-04 (×6): 81 mg via ORAL
  Filled 2011-06-28 (×7): qty 1

## 2011-06-28 MED ORDER — ASPIRIN 81 MG PO CHEW
324.0000 mg | CHEWABLE_TABLET | ORAL | Status: AC
  Administered 2011-06-28: 324 mg via ORAL
  Filled 2011-06-28: qty 4

## 2011-06-28 MED ORDER — SODIUM CHLORIDE 0.9 % IV SOLN
250.0000 mL | INTRAVENOUS | Status: DC | PRN
  Administered 2011-06-28 – 2011-07-01 (×2): 500 mL via INTRAVENOUS

## 2011-06-28 MED ORDER — HEPARIN BOLUS VIA INFUSION
2000.0000 [IU] | Freq: Once | INTRAVENOUS | Status: AC
  Administered 2011-06-28: 2000 [IU] via INTRAVENOUS

## 2011-06-28 MED ORDER — ALBUTEROL SULFATE (5 MG/ML) 0.5% IN NEBU
INHALATION_SOLUTION | RESPIRATORY_TRACT | Status: AC
  Administered 2011-06-28: 13:00:00
  Filled 2011-06-28: qty 1

## 2011-06-28 MED ORDER — ONDANSETRON HCL 4 MG/2ML IJ SOLN: 4.0000 mg | Freq: Four times a day (QID) | INTRAMUSCULAR | Status: DC | PRN

## 2011-06-28 MED ORDER — NITROGLYCERIN 0.4 MG SL SUBL: 0.4000 mg | SUBLINGUAL_TABLET | SUBLINGUAL | Status: DC | PRN

## 2011-06-28 MED ORDER — SODIUM CHLORIDE 0.9 % IJ SOLN: 3.0000 mL | INTRAMUSCULAR | Status: DC | PRN

## 2011-06-28 MED ORDER — NITROGLYCERIN 0.4 MG SL SUBL
SUBLINGUAL_TABLET | SUBLINGUAL | Status: AC
  Administered 2011-06-28: 0.4 mg via SUBLINGUAL
  Filled 2011-06-28: qty 25

## 2011-06-28 MED ORDER — HEPARIN (PORCINE) IN NACL 100-0.45 UNIT/ML-% IJ SOLN
320.0000 [IU]/h | INTRAMUSCULAR | Status: AC
  Administered 2011-06-28: 550 [IU]/h via INTRAVENOUS
  Administered 2011-06-30 – 2011-07-01 (×2): 650 [IU]/h via INTRAVENOUS
  Administered 2011-07-02: 300 [IU]/h via INTRAVENOUS
  Filled 2011-06-28 (×5): qty 250

## 2011-06-28 MED ORDER — ASPIRIN 300 MG RE SUPP
300.0000 mg | RECTAL | Status: AC
  Filled 2011-06-28: qty 1

## 2011-06-28 MED ORDER — LEVOTHYROXINE SODIUM 100 MCG PO TABS
100.0000 ug | ORAL_TABLET | Freq: Every day | ORAL | Status: DC
  Administered 2011-06-29 – 2011-07-04 (×6): 100 ug via ORAL
  Filled 2011-06-28 (×6): qty 1

## 2011-06-28 MED ORDER — NITROGLYCERIN 0.4 MG SL SUBL
0.4000 mg | SUBLINGUAL_TABLET | SUBLINGUAL | Status: DC | PRN
  Administered 2011-06-28: 0.4 mg via SUBLINGUAL

## 2011-06-28 MED ORDER — METOPROLOL TARTRATE 25 MG PO TABS
37.5000 mg | ORAL_TABLET | Freq: Two times a day (BID) | ORAL | Status: DC
  Administered 2011-06-28 – 2011-07-04 (×12): 37.5 mg via ORAL
  Filled 2011-06-28 (×13): qty 1

## 2011-06-28 MED ORDER — FUROSEMIDE 10 MG/ML IJ SOLN
40.0000 mg | Freq: Once | INTRAMUSCULAR | Status: AC
  Administered 2011-06-28: 40 mg via INTRAVENOUS
  Filled 2011-06-28: qty 4

## 2011-06-28 MED ORDER — SODIUM CHLORIDE 0.9 % IJ SOLN
3.0000 mL | Freq: Two times a day (BID) | INTRAMUSCULAR | Status: DC
Start: 2011-06-28 — End: 2011-07-04
  Administered 2011-06-28 – 2011-07-02 (×3): 3 mL via INTRAVENOUS

## 2011-06-28 MED ORDER — FUROSEMIDE 10 MG/ML IJ SOLN
80.0000 mg | Freq: Two times a day (BID) | INTRAMUSCULAR | Status: DC
  Administered 2011-06-28 – 2011-07-02 (×9): 80 mg via INTRAVENOUS
  Filled 2011-06-28 (×13): qty 8

## 2011-06-28 MED ORDER — ACETAMINOPHEN 325 MG PO TABS
650.0000 mg | ORAL_TABLET | ORAL | Status: DC | PRN
  Administered 2011-06-29 – 2011-06-30 (×2): 650 mg via ORAL
  Filled 2011-06-28 (×2): qty 2

## 2011-06-28 MED ORDER — CLOPIDOGREL BISULFATE 75 MG PO TABS
75.0000 mg | ORAL_TABLET | Freq: Every day | ORAL | Status: DC
  Administered 2011-06-29 – 2011-07-04 (×6): 75 mg via ORAL
  Filled 2011-06-28 (×7): qty 1

## 2011-06-28 MED ORDER — AMLODIPINE BESYLATE 2.5 MG PO TABS
2.5000 mg | ORAL_TABLET | Freq: Every day | ORAL | Status: DC
  Administered 2011-06-28 – 2011-07-04 (×7): 2.5 mg via ORAL
  Filled 2011-06-28 (×7): qty 1

## 2011-06-28 NOTE — ED Notes (Signed)
Patient presents to ed c/o sob and chest pain onset this am ems states patient was able to speak in complete sentences was given neb 5 mg treatment via ems  After breathing treatment was able to speak in only  Broken sentences rales  Bilaterally. Upon arrival alert oriented Dr. Preston Fleeting and resp at bedside. Patient states she is feeling better.

## 2011-06-28 NOTE — ED Notes (Signed)
Positive troponin reported to dr Preston Fleeting

## 2011-06-28 NOTE — Progress Notes (Signed)
CRITICAL VALUE ALERT  Critical value received:  CKMB = 39  Date of notification:  06/28/11  Time of notification:  2140  Critical value read back:yes  Nurse who received alert:  Ruffin Pyo   MD notified (1st page):  [facility] - M. Burnadette Peter PA  Time of first page:  2152  MD notified (2nd page):  Time of second page:  Responding MD:  M.Burnadette Peter PA  Time MD responded:  2154

## 2011-06-28 NOTE — ED Notes (Signed)
The pt has a room assignment but no orders to be moved upstairs

## 2011-06-28 NOTE — H&P (Signed)
Patient ID: Lori Trujillo MRN: [id number], DOB/AGE: 08-20-1921   Admit date: 06/28/2011   Primary Physician: Willow Ora, MD, MD Primary Cardiologist: Odessa Fleming, MD  Pt. 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  Problem List  Past Medical History  Diagnosis Date  . Hyperlipidemia   . Hypertension   . Hypothyroidism 6/11    Thyroid ablation, s/p treatment with iodine  . Tachy-brady syndrome 2003    Per holter, s/p pacemaker implantation in [place] (pacemaker replaced 11/11 with Medtronic Device)  . Hypercalcemia   . TIA (transient ischemic attack) 2000    ?TIA; carotid u/s 1-15% and brain MRI lacunar dz  . Chronic neck pain 2002  . DJD (degenerative joint disease) of cervical spine 2002    MRI Cspine  . Pneumonia March 2013  . CAD (coronary artery disease)     a. Cath 04/2011: LM 90, LAD Critical Ca stenosis ost, LCX 95 ost, RCA 70p, R->L collats, EF 35% - med managed.  . Ischemic cardiomyopathy     a. EF 35% 04/2011 LV Gram.    Past Surgical History  Procedure Date  . Abdominal hysterectomy   . Tonsillectomy      Allergies  Allergies  Allergen Reactions  . Simvastatin     unknown    HPI  78 y/o female with the above problem list.  She was hospitalized in April of this year and found @ that time to have severe 3vd and ICM.  She has been medically managed.  She has been doing well but recently has been having exertional chest pain.  Today, she developed recurrent chest pain and dyspnea.  EMS was called and she was taken to the [facility] ED where she was placed on bipap and treated for acute pulmonary edema.  She is now diuresing and off of bipap.  She is currently pain free.  Troponin is elevated.  Home Medications  Prior to Admission medications   Medication Sig Start Date End Date Taking? Authorizing Provider  amLODipine (NORVASC) 2.5 MG tablet Take 1 tablet (2.5 mg total) by mouth daily. 05/10/11  Yes Joline Salt Barrett, PA  aspirin EC 81 MG tablet Take 1 tablet (81 mg total) by mouth daily. 05/10/11  Yes Rhonda G Barrett, PA  cholecalciferol (VITAMIN D) 1000 UNITS tablet Take 1,000 Units by mouth daily.     Yes Historical Provider, MD  clopidogrel (PLAVIX) 75 MG tablet Take 1 tablet (75 mg total) by mouth daily with breakfast. 05/10/11 05/09/12 Yes Rhonda G Barrett, PA  hydrochlorothiazide (HYDRODIURIL) 25 MG tablet Take 12.5 mg by mouth daily. 04/03/11  Yes Wanda Plump, MD  levothyroxine (SYNTHROID, LEVOTHROID) 100 MCG tablet Take 100 mcg by mouth daily.   Yes Historical Provider, MD  metoprolol tartrate (LOPRESSOR) 25 MG tablet Take 1.5 tablets (37.5 mg total) by mouth 2 (two) times daily. 05/10/11  Yes Rhonda G Barrett, PA  nitroGLYCERIN (NITROSTAT) 0.4 MG SL tablet Place 0.4 mg under the tongue every 5 (five) minutes as needed. For chest pain 04/10/11 04/09/12 Yes Wanda Plump, MD  rosuvastatin (CRESTOR) 5 MG tablet Take 5 mg by mouth 2 (two) times a week. Take on Tuesdays and Saturdays. 05/22/11  Yes Duke Salvia, MD   Family History  Family History  Problem Relation Age of Onset  . Cancer Neg Hx     No breast or colon  . Goiter Neg Hx  No other thyroid problems  . Heart attack Son    Social History  History   Social History  . Marital Status: Widowed    Spouse Name: N/A    Number of Children: N/A  . Years of Education: N/A   Occupational History  . Not on file.   Social History Main Topics  . Smoking status: Former Smoker -- 0.2 packs/day    Quit date: 01/21/1973  . Smokeless tobacco: Not on file  . Alcohol Use: No  . Drug Use: No  . Sexually Active: No   Other Topics Concern  . Not on file   Social History Narrative   Lives by self, independent on her ADL, sometimes has difficult time getting in and out of shower.Not driving anymore.Son and daughter in [place].Still does tai-chi.    Review of Systems General:  No chills, fever, night sweats or weight changes.    Cardiovascular:  +++ chest pain & dyspnea on exertion.  No edema, orthopnea, palpitations, paroxysmal nocturnal dyspnea. Dermatological: No rash, lesions/masses Respiratory: No cough, dyspnea Urologic: No hematuria, dysuria Abdominal:   No nausea, vomiting, diarrhea, bright red blood per rectum, melena, or hematemesis Neurologic:  No visual changes, wkns, changes in mental status. All other systems reviewed and are otherwise negative except as noted above.  Physical Exam  Blood pressure 122/57, pulse 60, resp. rate 17, SpO2 99.00%.  General: Pleasant, NAD Psych: Normal affect. Neuro: Alert and oriented X 3. Moves all extremities spontaneously. HEENT: Normal  Neck: Supple without bruits.  Neck veins relatively flat. Lungs:  Resp regular, slight increase wob.  Crackles all the way bilat. Heart: RRR no s3, + s4, or murmurs. Abdomen: Soft, non-tender, non-distended, BS + x 4.  Extremities: No clubbing, cyanosis or edema. DP/PT/Radials 2+ and equal bilaterally.  Labs   Basename 06/28/11 1240  CKTOTAL --  CKMB --  TROPONINI 0.84*   Lab Results  Component Value Date   WBC 10.8* 06/28/2011   HGB 13.7 06/28/2011   HCT 39.8 06/28/2011   MCV 88.4 06/28/2011   PLT 233 06/28/2011    Lab 06/28/11 1239  NA 137  K 4.1  CL 101  CO2 21  BUN 19  CREATININE 0.88  CALCIUM 10.4  PROT --  BILITOT --  ALKPHOS --  ALT --  AST --  GLUCOSE 189*   Lab Results  Component Value Date   CHOL 218* 05/04/2011   HDL 109 05/04/2011   LDLCALC 100* 05/04/2011   TRIG 46 05/04/2011      Radiology/Studies  Dg Chest Portable 1 View  06/28/2011  *RADIOLOGY REPORT*  Clinical Data: Respiratory distress  PORTABLE CHEST - 1 VIEW  Comparison: 04/03/2011  Findings: Diffuse bilateral airspace disease in a pulmonary edema pattern associated with cardiomegaly.  Dual lead left subclavian pacemaker device and leads stable.  No pneumothorax.  IMPRESSION: CHF.  Original Report Authenticated By: Donavan Burnet, M.D.    ECG  A paced, lvh, inf lat st dep/twi.  No acute changes.  ASSESSMENT AND PLAN  1.  Acute pulmonary Edema/NSTEMI/CAD:  Pt presents with recurrent c/p, sob, and pulmonary edema.  Now off of bipap.  Admit, cycle ce, diurese, add heparin/ntg.  Cont asa, plavix, bb, statin.  Known severe 3vd w/o good interventional options.  Not a surgical candidate.  Cont med rx.  Long discussion re: code status.  They will discuss as family as she is currently a full code.  2.  ICM/Acute on chronic syst chf:  In setting  of above.  Diurese.  Cont bb,  Consider acei.  3.  HTN:  Stable.  4.  HL:  On statin.   Signed, Nicolasa Ducking, NP 06/28/2011, 3:45 PM   I have personally seen and examined this patient with Ward Givens, NP.  I agree with the assessment and plan as outlined above. She is admitted with NSTEMI/flash pulmonary edema. She has inoperable triple vessel CAD including severe calcified left main stenosis. She is not a candidate for PCI or CABG. Will admit and treat with IV heparin/NTG and diurese. No cardiac cath is indicated as she has end stage disease. She is still a Full Code and this has been addressed today. She and family will discuss tonight. Given her age and severe CAD, we have encouraged her to consider changing her orders to DNR as any life saving maneuvers in the setting of a cardiac arrest in her situation would be futile.   Sheleen Conchas 4:39 PM 06/28/2011

## 2011-06-28 NOTE — Progress Notes (Signed)
ANTICOAGULATION CONSULT NOTE - Initial Consult  Pharmacy Consult for Heparin Indication: NSTEMI  Allergies  Allergen Reactions  . Simvastatin     unknown    Patient Measurements: Height: 5\' 2"  (157.5 cm) Weight: 100 lb 1.4 oz (45.4 kg) IBW/kg (Calculated) : 50.1   Vital Signs: BP: 122/57 mmHg (06/07 1517) Pulse Rate: 62  (06/07 1553)  Labs:  Basename 06/28/11 1240 06/28/11 1239  HGB -- 13.7  HCT -- 39.8  PLT -- 233  APTT -- --  LABPROT -- --  INR -- --  HEPARINUNFRC -- --  CREATININE -- 0.88  CKTOTAL -- --  CKMB -- --  TROPONINI 0.84* --    Estimated Creatinine Clearance: 32.3 ml/min (by C-G formula based on Cr of 0.88).   Medical History: Past Medical History  Diagnosis Date  . Hyperlipidemia   . Hypertension   . Hypothyroidism 6/11    Thyroid ablation, s/p treatment with iodine  . Tachy-brady syndrome 2003    Per holter, s/p pacemaker implantation in [place] (pacemaker replaced 11/11 with Medtronic Device)  . Hypercalcemia   . TIA (transient ischemic attack) 2000    ?TIA; carotid u/s 1-15% and brain MRI lacunar dz  . Chronic neck pain 2002  . DJD (degenerative joint disease) of cervical spine 2002    MRI Cspine  . Pneumonia March 2013  . CAD (coronary artery disease)     a. Cath 04/2011: LM 90, LAD Critical Ca stenosis ost, LCX 95 ost, RCA 70p, R->L collats, EF 35% - med managed.  . Ischemic cardiomyopathy     a. EF 35% 04/2011 LV Gram.    Medications:  Home: Norvasc, ASA 81, Vit D, Plavix, HCTZ, Levothyroxine, Metoprolol, NTG SL, Crestor  Assessment: 78 y.o. female presents with NSTEMI/acute pulmonary edema. Pt with severe inoperable 3v CAD. No cardiac cath indicated. To begin heparin.  Goal of Therapy:  Heparin level 0.3-0.7 units/ml Monitor platelets by anticoagulation protocol: Yes   Plan:  1. Heparin IV bolus 2000 units 2. Heparin gtt at 550 units/hr 3. 8 hr heparin level 4. Daily heparin level and CBC  Christoper Fabian, PharmD, BCPS Clinical  pharmacist, pager [phone] 06/28/2011,4:56 PM

## 2011-06-28 NOTE — ED Provider Notes (Signed)
History     CSN: [id number]  Arrival date & time 06/28/11  1236   First MD Initiated Contact with Patient 06/28/11 1237      Chief Complaint  Patient presents with  . Respiratory Distress    (Consider location/radiation/quality/duration/timing/severity/associated sxs/prior treatment) The history is provided by the patient and the EMS personnel.   78 year old female is brought in by embolus because of difficulty breathing. She's been having progressive dyspnea over the last 3 days. Dyspnea is primarily exertional. She has not noticed any orthopnea. She has had some mild superior chest pain which is dull in nature. She denies nausea or vomiting or diaphoresis. Dyspnea with severe. Ambulance personnel noted wheezing and gave her a breathing treatment and following the breathing treatment noted that she had some rales. She continued to be hypoxic so she was placed on CPAP. She has taken her aspirin today.  Past Medical History  Diagnosis Date  . Hyperlipidemia   . Hypertension   . Hypothyroidism 6/11    Thyroid ablation, s/p treatment with iodine  . Tachy-brady syndrome 2003    Per holter, s/p pacemaker implantation in [place] (pacemaker replaced 11/11 with Medtronic Device)  . Hypercalcemia   . TIA (transient ischemic attack) 2000    ?TIA; carotid u/s 1-15% and brain MRI lacunar dz  . Chronic neck pain 2002  . DJD (degenerative joint disease) of cervical spine 2002    MRI Cspine  . Echocardiogram shows left ventricular diastolic dysfunction 2002    And (-) stress ECHO (diastolic dysfunction later improved)  . Pneumonia March 2013    Past Surgical History  Procedure Date  . Abdominal hysterectomy   . Tonsillectomy     Family History  Problem Relation Age of Onset  . Cancer Neg Hx     No breast or colon  . Goiter Neg Hx     No other thyroid problems  . Heart attack Son     History  Substance Use Topics  . Smoking status: Former Smoker -- 0.2 packs/day    Quit date:  01/21/1973  . Smokeless tobacco: Not on file  . Alcohol Use: Not on file    OB History    Grav Para Term Preterm Abortions TAB SAB Ect Mult Living                  Review of Systems  All other systems reviewed and are negative.    Allergies  Simvastatin  Home Medications   Current Outpatient Rx  Name Route Sig Dispense Refill  . AMLODIPINE BESYLATE 2.5 MG PO TABS Oral Take 1 tablet (2.5 mg total) by mouth daily. 30 tablet 11  . ASPIRIN EC 81 MG PO TBEC Oral Take 1 tablet (81 mg total) by mouth daily. 30 tablet   . VITAMIN D 1000 UNITS PO TABS Oral Take 1,000 Units by mouth daily.      Marland Kitchen CLOPIDOGREL BISULFATE 75 MG PO TABS Oral Take 1 tablet (75 mg total) by mouth daily with breakfast. 30 tablet 11  . DOCUSATE SODIUM 100 MG PO CAPS Oral Take 100 mg by mouth as needed. Soft stool    . HYDROCHLOROTHIAZIDE 25 MG PO TABS Oral Take 12.5 mg by mouth daily.    . ISOSORBIDE MONONITRATE ER 120 MG PO TB24 Oral Take 1 tablet (120 mg total) by mouth daily. 30 tablet 6  . LEVOTHYROXINE SODIUM 100 MCG PO TABS  TAKE ONE TABLET BY MOUTH EVERY DAY 30 tablet 6  . METOPROLOL TARTRATE  25 MG PO TABS Oral Take 1.5 tablets (37.5 mg total) by mouth 2 (two) times daily. 90 tablet 11  . NITROGLYCERIN 0.4 MG SL SUBL Sublingual Place 0.4 mg under the tongue every 5 (five) minutes as needed. For chest pain    . ROSUVASTATIN CALCIUM 5 MG PO TABS  Take one tablet by mouth twice a week      BP 137/69  Pulse 61  Resp 30  SpO2 95%  Physical Exam  Nursing note and vitals reviewed.  78 year old female with BiPAP mask in place. Vital signs are significant for tachypnea with respiratory rate of 30. Oxygen saturation is 95% which is normal, but this is with supplemental oxygen and while on BiPAP. Status of his back and atraumatic. PERRLA, EOMI. Neck is nontender and supple without adenopathy, but JVD is noted at 90. Back is nontender. Lungs have rales diffusely most consistent with pulmonary edema. Heart has  regular rate and rhythm without murmur. Abdomen is soft, flat, nontender without masses or hepatosplenomegaly. Extremities have 1+ pedal edema, no cyanosis or edema. Skin is warm and dry without rash. Neurologic, she is awake and alert. Cranial nerves are intact. There are no focal motor or sensory deficits.  ED Course  Procedures (including critical care time)  Results for orders placed during the hospital encounter of 06/28/11  CBC      Component Value Range   WBC 10.8 (*) 4.0 - 10.5 (K/uL)   RBC 4.50  3.87 - 5.11 (MIL/uL)   Hemoglobin 13.7  12.0 - 15.0 (g/dL)   HCT 91.4  78.2 - 95.6 (%)   MCV 88.4  78.0 - 100.0 (fL)   MCH 30.4  26.0 - 34.0 (pg)   MCHC 34.4  30.0 - 36.0 (g/dL)   RDW 21.3 (*) 08.6 - 15.5 (%)   Platelets 233  150 - 400 (K/uL)  DIFFERENTIAL      Component Value Range   Neutrophils Relative 69  43 - 77 (%)   Neutro Abs 7.4  1.7 - 7.7 (K/uL)   Lymphocytes Relative 24  12 - 46 (%)   Lymphs Abs 2.6  0.7 - 4.0 (K/uL)   Monocytes Relative 7  3 - 12 (%)   Monocytes Absolute 0.7  0.1 - 1.0 (K/uL)   Eosinophils Relative 0  0 - 5 (%)   Eosinophils Absolute 0.0  0.0 - 0.7 (K/uL)   Basophils Relative 0  0 - 1 (%)   Basophils Absolute 0.0  0.0 - 0.1 (K/uL)  BASIC METABOLIC PANEL      Component Value Range   Sodium 137  135 - 145 (mEq/L)   Potassium 4.1  3.5 - 5.1 (mEq/L)   Chloride 101  96 - 112 (mEq/L)   CO2 21  19 - 32 (mEq/L)   Glucose, Bld 189 (*) 70 - 99 (mg/dL)   BUN 19  6 - 23 (mg/dL)   Creatinine, Ser 5.78  0.50 - 1.10 (mg/dL)   Calcium 46.9  8.4 - 10.5 (mg/dL)   GFR calc non Af Amer 57 (*) >90 (mL/min)   GFR calc Af Amer 67 (*) >90 (mL/min)  TROPONIN I      Component Value Range   Troponin I 0.84 (*) <0.30 (ng/mL)  PRO B NATRIURETIC PEPTIDE      Component Value Range   Pro B Natriuretic peptide (BNP) 4663.0 (*) 0 - 450 (pg/mL)   Dg Chest Portable 1 View  06/28/2011  *RADIOLOGY REPORT*  Clinical Data: Respiratory distress  PORTABLE  CHEST - 1 VIEW   Comparison: 04/03/2011  Findings: Diffuse bilateral airspace disease in a pulmonary edema pattern associated with cardiomegaly.  Dual lead left subclavian pacemaker device and leads stable.  No pneumothorax.  IMPRESSION: CHF.  Original Report Authenticated By: Donavan Burnet, M.D.      1. Pulmonary edema   2. Elevated troponin    CRITICAL CARE Performed by: ZOXWR,UEAVW   Total critical care time: 50 minutes  Critical care time was exclusive of separately billable procedures and treating other patients.  Critical care was necessary to treat or prevent imminent or life-threatening deterioration.  Critical care was time spent personally by me on the following activities: development of treatment plan with patient and/or surrogate as well as nursing, discussions with consultants, evaluation of patient's response to treatment, examination of patient, obtaining history from patient or surrogate, ordering and performing treatments and interventions, ordering and review of laboratory studies, ordering and review of radiographic studies, pulse oximetry and re-evaluation of patient's condition.    MDM  Dyspnea which appears to be due to acute pulmonary edema. Chest x-ray will be obtained and she'll be given IV Lasix. Laboratory workup has been initiated as well. Cardiac monitor shows her rhythm is electronically paced. ECG will be obtained but is unlikely to be helpful because of electronically paced rhythm. Her prior charts were reviewed and she does have a diagnosis of ischemic cardiomyopathy with three-vessel disease.  Wheezes and improvement in respiratory status. Her respiratory support on BiPAP was weaned down. Case has been discussed with Dr. Jomarie Longs on call for [facility] hospitalists and also with [facility] cardiology. Dr. Jomarie Longs agrees to admit the patient and the [facility] cardiologist to see her in consult.      Dione Booze, MD 06/28/11 1540

## 2011-06-29 DIAGNOSIS — I495 Sick sinus syndrome: Secondary | ICD-10-CM

## 2011-06-29 LAB — BASIC METABOLIC PANEL
BUN: 18 mg/dL (ref 6–23)
Calcium: 9.6 mg/dL (ref 8.4–10.5)
Chloride: 100 mEq/L (ref 96–112)
Creatinine, Ser: 0.79 mg/dL (ref 0.50–1.10)
GFR calc Af Amer: 84 mL/min — ABNORMAL LOW (ref >90)

## 2011-06-29 LAB — HEPARIN LEVEL (UNFRACTIONATED): Heparin Unfractionated: 0.3 IU/mL (ref 0.30–0.70)

## 2011-06-29 LAB — CARDIAC PANEL(CRET KIN+CKTOT+MB+TROPI)
CK, MB: 20.3 ng/mL (ref 0.3–4.0)
CK, MB: 9.6 ng/mL (ref 0.3–4.0)
Relative Index: 7.7 — ABNORMAL HIGH (ref 0.0–2.5)
Relative Index: 5.5 — ABNORMAL HIGH (ref 0.0–2.5)
Relative Index: 3.6 — ABNORMAL HIGH (ref 0.0–2.5)
Total CK: 371 U/L — ABNORMAL HIGH (ref 7–177)
Troponin I: 16.05 ng/mL (ref <0.30)
Troponin I: 8.47 ng/mL (ref <0.30)
Troponin I: 7.46 ng/mL (ref <0.30)
Troponin I: 4.78 ng/mL (ref <0.30)

## 2011-06-29 LAB — CBC
Platelets: 209 10*3/uL (ref 150–400)
RBC: 4.21 MIL/uL (ref 3.87–5.11)
RDW: 15.6 % — ABNORMAL HIGH (ref 11.5–15.5)
WBC: 8.2 10*3/uL (ref 4.0–10.5)

## 2011-06-29 MED ORDER — BIOTENE DRY MOUTH MT LIQD
15.0000 mL | Freq: Two times a day (BID) | OROMUCOSAL | Status: DC
  Administered 2011-06-30 – 2011-07-04 (×8): 15 mL via OROMUCOSAL

## 2011-06-29 NOTE — Progress Notes (Addendum)
SUBJECTIVE: The patient is admitted with CHF and NSTEMI with a robust troponin elevation.  She has been treated conservatively and is presently stable.  At this time, she denies chest pain, shortness of breath, or any new concerns.     Marland Kitchen albuterol      . amLODipine  2.5 mg Oral Daily  . antiseptic oral rinse  15 mL Mouth Rinse BID  . aspirin  324 mg Oral NOW   Or  . aspirin  300 mg Rectal NOW  . aspirin EC  81 mg Oral Daily  . atorvastatin  10 mg Oral q1800  . cholecalciferol  1,000 Units Oral Daily  . clopidogrel  75 mg Oral Q breakfast  . furosemide  40 mg Intravenous Once  . furosemide  80 mg Intravenous BID  . heparin  2,000 Units Intravenous Once  . levothyroxine  100 mcg Oral Daily  . metoprolol tartrate  37.5 mg Oral BID  . sodium chloride  3 mL Intravenous Q12H      . heparin 550 Units/hr (06/28/11 2000)  . nitroGLYCERIN 5 mcg/min (06/28/11 2045)    OBJECTIVE: Physical Exam: Filed Vitals:   06/29/11 0500 06/29/11 0600 06/29/11 0700 06/29/11 0738  BP: 108/51 121/57 123/53   Pulse: 60 62 63   Temp:    98.4 F (36.9 C)  TempSrc:    Oral  Resp: 12 21 14    Height:      Weight: 95 lb 7.4 oz (43.3 kg)     SpO2: 99% 100% 100%     Intake/Output Summary (Last 24 hours) at 06/29/11 0840 Last data filed at 06/29/11 0700  Gross per 24 hour  Intake 507.84 ml  Output   2825 ml  Net -2317.16 ml    Telemetry reveals atrial pacing  GEN- The patient is elderly appearing, alert and oriented x 3 today.   Head- normocephalic, atraumatic Eyes-  Sclera clear, conjunctiva pink Ears- hearing intact Oropharynx- clear Neck + JVD Lungs- few basilar rales, normal work of breathing Heart- Regular rate and rhythm,   GI- soft, NT, ND, + BS Extremities- no clubbing, cyanosis, or edema Skin- no rash or lesion Psych- euthymic mood, full affect Neuro- strength and sensation are intact  LABS: Basic Metabolic Panel:  Basename 06/29/11 0500 06/28/11 1239  NA 139 137  K 3.6  4.1  CL 100 101  CO2 24 21  GLUCOSE 112* 189*  BUN 18 19  CREATININE 0.79 0.88  CALCIUM 9.6 10.4  MG -- --  PHOS -- --   Liver Function Tests: No results found for this basename: AST:2,ALT:2,ALKPHOS:2,BILITOT:2,PROT:2,ALBUMIN:2 in the last 72 hours No results found for this basename: LIPASE:2,AMYLASE:2 in the last 72 hours CBC:  Basename 06/29/11 0500 06/28/11 1239  WBC 8.2 10.8*  NEUTROABS -- 7.4  HGB 12.6 13.7  HCT 37.1 39.8  MCV 88.1 88.4  PLT 209 233   Cardiac Enzymes:  Basename 06/29/11 0033 06/28/11 1954 06/28/11 1240  CKTOTAL 468* 456* --  CKMB 36.0* 39.0* --  CKMBINDEX -- -- --  TROPONINI 16.05* 5.88* 0.84*   RADIOLOGY: Dg Chest Portable 1 View  06/28/2011  *RADIOLOGY REPORT*  Clinical Data: Respiratory distress  PORTABLE CHEST - 1 VIEW  Comparison: 04/03/2011  Findings: Diffuse bilateral airspace disease in a pulmonary edema pattern associated with cardiomegaly.  Dual lead left subclavian pacemaker device and leads stable.  No pneumothorax.  IMPRESSION: CHF.  Original Report Authenticated By: Donavan Burnet, M.D.    ASSESSMENT AND PLAN:  Principal  Problem:  *Acute on chronic systolic CHF (congestive heart failure) Active Problems:  HYPERLIPIDEMIA  HYPERTENSION  Ischemic cardiomyopathy  HYPOTHYROIDISM  CAD (coronary artery disease)  Tachy-brady syndrome  1.  NSTEMI-  Pt with robust elevation in troponin, though with medical therapy she is presently comfortably.  ST segment depression is better today.  Per Dr Clifton Jaynia Fendley, she does not have any revascularization options. Given her advanced age, I agree with conservative medical measures.   She may be a candidate for palliative care if her family desires. Presently, we will continue heparin gtt and medical therapy. Wean NTG as able.  2. Acute on chronic systolic dysfunction- she remains volume overloaded Gentle diuresis as able Add ace inhibitor if renal function remains stable  3. HTN- stable  4. HL- on  statin  5. Bradycardia- s/p PPM  Hillis Range, MD 06/29/2011 8:40 AM

## 2011-06-29 NOTE — Progress Notes (Signed)
CRITICAL VALUE ALERT  Critical value received:  Troponin = 16.05  Date of notification:  06/29/2011  Time of notification:  0141  Critical value read back:yes  Nurse who received alert:  Ruffin Pyo  MD notified (1st page):  Dr Maryelizabeth Kaufmann  Time of first page:  0152  MD notified (2nd page):  Time of second page:  Responding MD:  Dr Maryelizabeth Kaufmann  Time MD responded:  [phone]

## 2011-06-29 NOTE — Progress Notes (Signed)
ANTICOAGULATION CONSULT NOTE - Follow Up Consult  Pharmacy Consult for heparin Indication: NSTEMI  Labs:  Basename 06/29/11 0033 06/28/11 1954 06/28/11 1240 06/28/11 1239  HGB -- -- -- 13.7  HCT -- -- -- 39.8  PLT -- -- -- 233  APTT -- -- -- --  LABPROT -- -- -- --  INR -- -- -- --  HEPARINUNFRC 0.30 -- -- --  CREATININE -- -- -- 0.88  CKTOTAL -- 456* -- --  CKMB -- 39.0* -- --  TROPONINI -- 5.88* 0.84* --     Assessment/Plan: 78yo female therapeutic on heparin with initial dosing for NSTEMI with inoperable 3VD.  At low end of goal but risk of accumulation given size/age/CrCl so will continue gtt at current rate for now and confirm stable with am labs.  Colleen Can PharmD BCPS 06/29/2011,1:29 AM

## 2011-06-29 NOTE — Progress Notes (Signed)
ANTICOAGULATION CONSULT NOTE - Follow Up Consult  Pharmacy Consult for heparin Indication: NSTEMI  Labs:  Basename 06/29/11 0948 06/29/11 0500 06/29/11 0033 06/28/11 1954 06/28/11 1240 06/28/11 1239  HGB -- 12.6 -- -- -- 13.7  HCT -- 37.1 -- -- -- 39.8  PLT -- 209 -- -- -- 233  APTT -- -- -- -- -- --  LABPROT -- -- -- -- -- --  INR -- -- -- -- -- --  HEPARINUNFRC 0.26* -- 0.30 -- -- --  CREATININE -- 0.79 -- -- -- 0.88  CKTOTAL -- -- 468* 456* -- --  CKMB -- -- 36.0* 39.0* -- --  TROPONINI -- -- 16.05* 5.88* 0.84* --     Assessment/Plan: 78yo female therapeutic on heparin for NSTEMI with inoperable 3VD. HL 0.26<---0.3 on 550 units/hour. Hgb 12.6<---13.7, plt 209<---233. Medical management will be the treatment for this patient's NSTEMI.  1. Increase heparin to 650 units/hour 2. Daily HL/CBC  Lori Trujillo, Lori Trujillo PharmD [phone] 06/29/2011,10:47 AM

## 2011-06-30 LAB — HEPARIN LEVEL (UNFRACTIONATED): Heparin Unfractionated: 0.49 IU/mL (ref 0.30–0.70)

## 2011-06-30 LAB — CBC
MCH: 29.8 pg (ref 26.0–34.0)
MCHC: 34.3 g/dL (ref 30.0–36.0)
Platelets: 190 10*3/uL (ref 150–400)
RBC: 4.29 MIL/uL (ref 3.87–5.11)

## 2011-06-30 LAB — URINE CULTURE
Colony Count: NO GROWTH
Culture  Setup Time: 201306071609

## 2011-06-30 NOTE — Progress Notes (Signed)
ANTICOAGULATION CONSULT NOTE - Follow Up Consult  Pharmacy Consult for heparin Indication: NSTEMI  Labs:  Basename 06/30/11 0505 06/29/11 2033 06/29/11 1353 06/29/11 0948 06/29/11 0942 06/29/11 0500 06/29/11 0033 06/28/11 1239  HGB 12.8 -- -- -- -- 12.6 -- --  HCT 37.3 -- -- -- -- 37.1 -- 39.8  PLT 190 -- -- -- -- 209 -- 233  APTT -- -- -- -- -- -- -- --  LABPROT -- -- -- -- -- -- -- --  INR -- -- -- -- -- -- -- --  HEPARINUNFRC 0.49 -- -- 0.26* -- -- 0.30 --  CREATININE -- -- -- -- -- 0.79 -- 0.88  CKTOTAL -- 264* 323* -- 371* -- -- --  CKMB -- 9.6* 14.3* -- 20.3* -- -- --  TROPONINI -- 4.78* 7.46* -- 8.47* -- -- --     Assessment/Plan: 78yo female therapeutic on heparin for NSTEMI with inoperable 3VD. HL 0.49<---0.26<---0.3 on 650 units/hour. Hgb 12.8<---12.6<---13.7, plt 190<---209<---233. Medical management will be the treatment for this patient's NSTEMI through 6/10.  1. Continue heparin at 650 units/hour 2. Daily HL/CBC  Lorre Opdahl, [place] R PharmD [phone] 06/30/2011,1:33 PM

## 2011-06-30 NOTE — Progress Notes (Addendum)
Patient ID: Lori Trujillo, female   DOB: 1921-06-14, 78 y.o.   MRN: [id number]   SUBJECTIVE:  Patient continues to feel better. She is fully alert. She has family in the room. She is diaries seen. She had an ischemic event this admission. The plan is to treat her medically. She has not yet been out of bed. She is still on heparin. She has a Foley in place. There is some leaking. She's not having any chest pain.   Filed Vitals:   06/30/11 0400 06/30/11 0500 06/30/11 0804 06/30/11 0805  BP: 114/52   121/55  Pulse: 59   62  Temp:   98.4 F (36.9 C)   TempSrc:   Oral   Resp: 19   13  Height:      Weight:  91 lb 11.4 oz (41.6 kg)    SpO2: 97%   100%    Intake/Output Summary (Last 24 hours) at 06/30/11 1105 Last data filed at 06/30/11 0900  Gross per 24 hour  Intake 1219.65 ml  Output   1850 ml  Net -630.35 ml    LABS: Basic Metabolic Panel:  Basename 06/29/11 0500 06/28/11 1239  NA 139 137  K 3.6 4.1  CL 100 101  CO2 24 21  GLUCOSE 112* 189*  BUN 18 19  CREATININE 0.79 0.88  CALCIUM 9.6 10.4  MG -- --  PHOS -- --   Liver Function Tests: No results found for this basename: AST:2,ALT:2,ALKPHOS:2,BILITOT:2,PROT:2,ALBUMIN:2 in the last 72 hours No results found for this basename: LIPASE:2,AMYLASE:2 in the last 72 hours CBC:  Basename 06/30/11 0505 06/29/11 0500 06/28/11 1239  WBC 8.6 8.2 --  NEUTROABS -- -- 7.4  HGB 12.8 12.6 --  HCT 37.3 37.1 --  MCV 86.9 88.1 --  PLT 190 209 --   Cardiac Enzymes:  Basename 06/29/11 2033 06/29/11 1353 06/29/11 0942  CKTOTAL 264* 323* 371*  CKMB 9.6* 14.3* 20.3*  CKMBINDEX -- -- --  TROPONINI 4.78* 7.46* 8.47*   BNP: No components found with this basename: POCBNP:3 D-Dimer: No results found for this basename: DDIMER:2 in the last 72 hours Hemoglobin A1C: No results found for this basename: HGBA1C in the last 72 hours Fasting Lipid Panel: No results found for this basename: CHOL,HDL,LDLCALC,TRIG,CHOLHDL,LDLDIRECT in the  last 72 hours Thyroid Function Tests: No results found for this basename: TSH,T4TOTAL,FREET3,T3FREE,THYROIDAB in the last 72 hours  RADIOLOGY: Dg Chest Portable 1 View  06/28/2011  *RADIOLOGY REPORT*  Clinical Data: Respiratory distress  PORTABLE CHEST - 1 VIEW  Comparison: 04/03/2011  Findings: Diffuse bilateral airspace disease in a pulmonary edema pattern associated with cardiomegaly.  Dual lead left subclavian pacemaker device and leads stable.  No pneumothorax.  IMPRESSION: CHF.  Original Report Authenticated By: Donavan Burnet, M.D.    PHYSICAL EXAM  Patient is oriented to person time and place. Affect is normal. She has 2 grandchildren in the room. Lungs reveal bilateral rales. She has kyphosis of the spine that is significant. She is no respiratory distress. Cardiac exam reveals S1 and S2. There is a soft systolic murmur.   There is no significant edema   TELEMETRY: I personally reviewed telemetry. There is pacing.   ASSESSMENT AND PLAN:   *Acute on chronic systolic CHF (congestive heart failure)      The patient is improving. She is on diuretics. She still has rales. The plan will be to continue her diuresis. There is no chemistry lab yet today. In addition I am always hesitant to add  an ACE inhibitor In this setting when we are actively diuresing. I will order chemistry lab. I will not start an ace this morning   HYPERLIPIDEMIA  HYPERTENSION  Sinoatrial node dysfunction  Ischemic cardiomyopathy  HYPOTHYROIDISM   NSTEMI (non-ST elevated myocardial infarction)   The patient had a significant cardiac event. No further workup is appropriate at this time however. Plan to continue heparin until tomorrow.   CAD (coronary artery disease)  Tachy-brady syndrome   Willa Rough 06/30/2011 11:05 AM

## 2011-07-01 DIAGNOSIS — I5023 Acute on chronic systolic (congestive) heart failure: Secondary | ICD-10-CM

## 2011-07-01 LAB — CBC
Hemoglobin: 12 g/dL (ref 12.0–15.0)
MCHC: 34.3 g/dL (ref 30.0–36.0)
RDW: 15.2 % (ref 11.5–15.5)
WBC: 9.1 10*3/uL (ref 4.0–10.5)

## 2011-07-01 LAB — BASIC METABOLIC PANEL
Calcium: 9.8 mg/dL (ref 8.4–10.5)
GFR calc Af Amer: 72 mL/min — ABNORMAL LOW (ref >90)
GFR calc Af Amer: 89 mL/min — ABNORMAL LOW (ref >90)
GFR calc non Af Amer: 63 mL/min — ABNORMAL LOW (ref >90)
GFR calc non Af Amer: 77 mL/min — ABNORMAL LOW (ref >90)
Potassium: 4.4 mEq/L (ref 3.5–5.1)
Sodium: 136 mEq/L (ref 135–145)
Sodium: 135 mEq/L (ref 135–145)

## 2011-07-01 LAB — HEPARIN LEVEL (UNFRACTIONATED): Heparin Unfractionated: 0.39 IU/mL (ref 0.30–0.70)

## 2011-07-01 MED ORDER — POTASSIUM CHLORIDE CRYS ER 20 MEQ PO TBCR
40.0000 meq | EXTENDED_RELEASE_TABLET | Freq: Once | ORAL | Status: AC
  Administered 2011-07-01: 40 meq via ORAL

## 2011-07-01 MED ORDER — POTASSIUM CHLORIDE CRYS ER 20 MEQ PO TBCR: 40.0000 meq | EXTENDED_RELEASE_TABLET | Freq: Two times a day (BID) | ORAL | Status: DC

## 2011-07-01 MED ORDER — POTASSIUM CHLORIDE CRYS ER 20 MEQ PO TBCR
40.0000 meq | EXTENDED_RELEASE_TABLET | Freq: Once | ORAL | Status: AC
  Administered 2011-07-01: 40 meq via ORAL
  Filled 2011-07-01: qty 2

## 2011-07-01 MED ORDER — POTASSIUM CHLORIDE CRYS ER 20 MEQ PO TBCR
40.0000 meq | EXTENDED_RELEASE_TABLET | Freq: Two times a day (BID) | ORAL | Status: DC
  Administered 2011-07-02 – 2011-07-04 (×5): 40 meq via ORAL
  Filled 2011-07-01 (×7): qty 2

## 2011-07-01 NOTE — Progress Notes (Signed)
Report was called to RN on unit 4700. Pt was transferred to room 4702

## 2011-07-01 NOTE — Progress Notes (Signed)
  Patient Name: Lori Trujillo      SUBJECTIVE: without chest pain  Past Medical History  Diagnosis Date  . Hyperlipidemia   . Hypertension   . Hypothyroidism 6/11    Thyroid ablation, s/p treatment with iodine  . Hypercalcemia   . TIA (transient ischemic attack) 2000    ?TIA; carotid u/s 1-15% and brain MRI lacunar dz  . Chronic neck pain 2002  . CAD (coronary artery disease)     a. Cath 04/2011: LM 90, LAD Critical Ca stenosis ost, LCX 95 ost, RCA 70p, R->L collats, EF 35% - med managed.  . Ischemic cardiomyopathy     a. EF 35% 04/2011 LV Gram.  . Tachy-brady syndrome 2003    Per holter, s/p pacemaker implantation in [place] (pacemaker replaced 11/11 with Medtronic Device)  . Pneumonia March 2013  . DJD (degenerative joint disease) of cervical spine 2002    MRI Cspine    PHYSICAL EXAM Filed Vitals:   07/01/11 0411 07/01/11 0459 07/01/11 0800 07/01/11 0825  BP:      Pulse:   62   Temp: 98.3 F (36.8 C)   97.8 F (36.6 C)  TempSrc: Oral   Oral  Resp:   15   Height:      Weight:  92 lb 13 oz (42.1 kg)    SpO2:   95%     General appearance: alert, cooperative and appears stated age Neck: no JVD Heart: regular rate and rhythm, S1, S2 normal, no murmur, click, rub or gallop Abdomen: soft, non-tender; bowel sounds normal; no masses,  no organomegaly Skin: Skin color, texture, turgor normal. No rashes or lesions Neurologic: Alert and oriented X 3, normal strength and tone. Normal symmetric reflexes. Normal coordination and gait  TELEMETRY: Reviewed telemetry pt in nsr :    Intake/Output Summary (Last 24 hours) at 07/01/11 0826 Last data filed at 07/01/11 0800  Gross per 24 hour  Intake   2952 ml  Output   2210 ml  Net    742 ml    LABS: Basic Metabolic Panel:  Lab 07/01/11 4782 06/29/11 0500 06/28/11 1239  NA 136 139 137  K 3.0* 3.6 4.1  CL 95* 100 101  CO2 31 24 21   GLUCOSE 112* 112* 189*  BUN 20 18 19   CREATININE 0.82 0.79 0.88  CALCIUM 9.8 9.6 --  MG --  -- --  PHOS -- -- --   Cardiac Enzymes:  Basename 06/29/11 2033 06/29/11 1353 06/29/11 0942  CKTOTAL 264* 323* 371*  CKMB 9.6* 14.3* 20.3*  CKMBINDEX -- -- --  TROPONINI 4.78* 7.46* 8.47*   CBC:  Lab 07/01/11 0433 06/30/11 0505 06/29/11 0500 06/28/11 1239  WBC 9.1 8.6 8.2 10.8*  NEUTROABS -- -- -- 7.4  HGB 12.0 12.8 12.6 13.7  HCT 35.0* 37.3 37.1 39.8  MCV 87.3 86.9 88.1 88.4  PLT 203 190 209 233       ASSESSMENT AND PLAN:  Patient Active Hospital Problem List: Acute on chronic systolic CHF (congestive heart failure) (06/28/2011)     HYPERTENSION (07/28/2006)  *   Ischemic cardiomyopathy (05/14/2006)  ** HYPOTHYROIDISM (01/23/2009)   NSTEMI (non-ST elevated myocardial infarction) (05/04/2011)   Ptwith severe 3Vcad and NSTEMI medical management with bb,, asa, plavix, CCB  Will look into time course in which Ranexa can be started  traansfer to floor    Signed, Sherryl Manges MD  07/01/2011

## 2011-07-01 NOTE — Progress Notes (Signed)
CARDIAC REHAB PHASE I   PRE:  Rate/Rhythm: 60 paced     BP: sitting 119/52    SaO2: 95 RA  MODE:  Ambulation: 80 ft   POST:  Rate/Rhythm: 84 pacing    BP: sitting 130/55     SaO2: would not register RA   Pt weak. Tried to use her cane but did not feel safe due to weakness. Used RW and felt more comfortable. Is thinking about getting RW for home, will discuss tomorrow. Fairly steady assist x1 with RW. Stated she was having 1/10 chest tightness before walk. Did not change with walking. Stated she felt good. No overt SOB. Will f/u. Getting ready to tx 4700.Lori Trujillo [place] CES, [place] [phone]

## 2011-07-01 NOTE — Progress Notes (Signed)
Utilization Review Completed.Lori Trujillo T6/10/2011   

## 2011-07-01 NOTE — Progress Notes (Signed)
ANTICOAGULATION CONSULT NOTE - Follow Up Consult  Pharmacy Consult for heparin Indication: NSTEMI  Labs:  Basename 07/01/11 0433 06/30/11 0505 06/29/11 2033 06/29/11 1353 06/29/11 0948 06/29/11 0942 06/29/11 0500 06/28/11 1239  HGB 12.0 12.8 -- -- -- -- -- --  HCT 35.0* 37.3 -- -- -- -- 37.1 --  PLT 203 190 -- -- -- -- 209 --  APTT -- -- -- -- -- -- -- --  LABPROT -- -- -- -- -- -- -- --  INR -- -- -- -- -- -- -- --  HEPARINUNFRC 0.39 0.49 -- -- 0.26* -- -- --  CREATININE 0.82 -- -- -- -- -- 0.79 0.88  CKTOTAL -- -- 264* 323* -- 371* -- --  CKMB -- -- 9.6* 14.3* -- 20.3* -- --  TROPONINI -- -- 4.78* 7.46* -- 8.47* -- --    Assessment 78yo female therapeutic on heparin for NSTEMI with inoperable 3VD. HL 0.39, Hgb 12.0, plt 203. Plans noted for medical management with possible d/c heparin today.  Goal of therapy Heparin level= 0.3-0.7 Platelet monitoring per protocol: yes  Plan -No heparin dose changes needed -CBC, and heparin level daily.  Harland German, Pharm D 07/01/2011 9:38 AM

## 2011-07-01 NOTE — Progress Notes (Signed)
Patient arrived to the unit. Placed on tele.  Call bell within reach .  Instructed to call for assistance before getting OOB.

## 2011-07-02 LAB — CBC
Hemoglobin: 11.8 g/dL — ABNORMAL LOW (ref 12.0–15.0)
MCHC: 34.4 g/dL (ref 30.0–36.0)
RBC: 3.94 MIL/uL (ref 3.87–5.11)
WBC: 9.5 10*3/uL (ref 4.0–10.5)

## 2011-07-02 LAB — BASIC METABOLIC PANEL
CO2: 27 mEq/L (ref 19–32)
Calcium: 9.9 mg/dL (ref 8.4–10.5)
Chloride: 99 mEq/L (ref 96–112)
Sodium: 137 mEq/L (ref 135–145)

## 2011-07-02 LAB — HEPARIN LEVEL (UNFRACTIONATED): Heparin Unfractionated: 0.28 IU/mL — ABNORMAL LOW (ref 0.30–0.70)

## 2011-07-02 NOTE — Progress Notes (Signed)
Patient Name: Lori Trujillo      SUBJECTIVE: without chest pain  Past Medical History  Diagnosis Date  . Hyperlipidemia   . Hypertension   . Hypothyroidism 6/11    Thyroid ablation, s/p treatment with iodine  . Hypercalcemia   . TIA (transient ischemic attack) 2000    ?TIA; carotid u/s 1-15% and brain MRI lacunar dz  . Chronic neck pain 2002  . CAD (coronary artery disease)     a. Cath 04/2011: LM 90, LAD Critical Ca stenosis ost, LCX 95 ost, RCA 70p, R->L collats, EF 35% - med managed.  . Ischemic cardiomyopathy     a. EF 35% 04/2011 LV Gram.  . Tachy-brady syndrome 2003    Per holter, s/p pacemaker implantation in [place] (pacemaker replaced 11/11 with Medtronic Device)  . Pneumonia March 2013  . DJD (degenerative joint disease) of cervical spine 2002    MRI Cspine    PHYSICAL EXAM Filed Vitals:   07/01/11 2123 07/01/11 2229 07/02/11 0255 07/02/11 0643  BP: 101/62  106/51 117/54  Pulse: 58 60 62 63  Temp: 98 F (36.7 C)  98.3 F (36.8 C) 97.9 F (36.6 C)  TempSrc: Oral  Oral Oral  Resp: 16  16 16   Height:      Weight:    102 lb 15.3 oz (46.7 kg)  SpO2: 96%  96% 95%    General appearance: alert, cooperative and appears stated age Neck: no JVD Heart: regular rate and rhythm, S1, S2 normal, no murmur, click, rub or gallop Abdomen: soft, non-tender; bowel sounds normal; no masses,  no organomegaly Skin: Skin color, texture, turgor normal. No rashes or lesions Neurologic: Alert and oriented X 3, normal strength and tone. Normal symmetric reflexes. Normal coordination and gait  TELEMETRY: Reviewed telemetry pt in nsr :    Intake/Output Summary (Last 24 hours) at 07/02/11 0841 Last data filed at 07/02/11 0654  Gross per 24 hour  Intake 1045.85 ml  Output   1300 ml  Net -254.15 ml    LABS: Basic Metabolic Panel:  Lab 07/02/11 1610 07/01/11 0826 07/01/11 0433 06/29/11 0500 06/28/11 1239  NA 137 135 136 139 137  K 3.7 4.4 3.0* 3.6 4.1  CL 99 95* 95* 100 101    CO2 27 28 31 24 21   GLUCOSE 107* 111* 112* 112* 189*  BUN 19 18 20 18 19   CREATININE 0.73 0.67 0.82 0.79 0.88  CALCIUM 9.9 10.4 -- -- --  MG -- -- -- -- --  PHOS -- -- -- -- --   Cardiac Enzymes:  Basename 06/29/11 2033 06/29/11 1353 06/29/11 0942  CKTOTAL 264* 323* 371*  CKMB 9.6* 14.3* 20.3*  CKMBINDEX -- -- --  TROPONINI 4.78* 7.46* 8.47*   CBC:  Lab 07/02/11 0500 07/01/11 0433 06/30/11 0505 06/29/11 0500 06/28/11 1239  WBC 9.5 9.1 8.6 8.2 10.8*  NEUTROABS -- -- -- -- 7.4  HGB 11.8* 12.0 12.8 12.6 13.7  HCT 34.3* 35.0* 37.3 37.1 39.8  MCV 87.1 87.3 86.9 88.1 88.4  PLT 192 203 190 209 233       ASSESSMENT AND PLAN:  Patient Active Hospital Problem List: Acute on chronic systolic CHF (congestive heart failure) (06/28/2011)     HYPERTENSION (07/28/2006)  *   Ischemic cardiomyopathy (05/14/2006)  ** HYPOTHYROIDISM (01/23/2009)   NSTEMI (non-ST elevated myocardial infarction) (05/04/2011)   Ptwith severe 3Vcad and NSTEMI medical management with bb,, asa, plavix, CCB  Will look into time course in which Ranexa can  be started  traansfer to floor    Signed, Sherryl Manges MD  07/02/2011

## 2011-07-02 NOTE — Progress Notes (Signed)
Patient Name: Lori Trujillo      SUBJECTIVE: without chest pain  Past Medical History  Diagnosis Date  . Hyperlipidemia   . Hypertension   . Hypothyroidism 6/11    Thyroid ablation, s/p treatment with iodine  . Hypercalcemia   . TIA (transient ischemic attack) 2000    ?TIA; carotid u/s 1-15% and brain MRI lacunar dz  . Chronic neck pain 2002  . CAD (coronary artery disease)     a. Cath 04/2011: LM 90, LAD Critical Ca stenosis ost, LCX 95 ost, RCA 70p, R->L collats, EF 35% - med managed.  . Ischemic cardiomyopathy     a. EF 35% 04/2011 LV Gram.  . Tachy-brady syndrome 2003    Per holter, s/p pacemaker implantation in [place] (pacemaker replaced 11/11 with Medtronic Device)  . Pneumonia March 2013  . DJD (degenerative joint disease) of cervical spine 2002    MRI Cspine    PHYSICAL EXAM Filed Vitals:   07/02/11 0255 07/02/11 0643 07/02/11 0848 07/02/11 1103  BP: 106/51 117/54 112/57 105/48  Pulse: 62 63 81 60  Temp: 98.3 F (36.8 C) 97.9 F (36.6 C)    TempSrc: Oral Oral    Resp: 16 16    Height:      Weight:  102 lb 15.3 oz (46.7 kg)    SpO2: 96% 95%      General appearance: alert, cooperative and appears stated age Neck: no JVD Heart: regular rate and rhythm, S1, S2 normal, no murmur, click, rub or gallop Abdomen: soft, non-tender; bowel sounds normal; no masses,  no organomegaly Skin: Skin color, texture, turgor normal. No rashes or lesions Neurologic: Alert and oriented X 3, normal strength and tone. Normal symmetric reflexes. Normal coordination and gait  TELEMETRY: Reviewed telemetry pt in nsr :    Intake/Output Summary (Last 24 hours) at 07/02/11 1302 Last data filed at 07/02/11 1246  Gross per 24 hour  Intake 1224.35 ml  Output   1525 ml  Net -300.65 ml    LABS: Basic Metabolic Panel:  Lab 07/02/11 1610 07/01/11 0826 07/01/11 0433 06/29/11 0500 06/28/11 1239  NA 137 135 136 139 137  K 3.7 4.4 3.0* 3.6 4.1  CL 99 95* 95* 100 101  CO2 27 28 31 24 21    GLUCOSE 107* 111* 112* 112* 189*  BUN 19 18 20 18 19   CREATININE 0.73 0.67 0.82 0.79 0.88  CALCIUM 9.9 10.4 -- -- --  MG -- -- -- -- --  PHOS -- -- -- -- --   Cardiac Enzymes:  Basename 06/29/11 2033 06/29/11 1353  CKTOTAL 264* 323*  CKMB 9.6* 14.3*  CKMBINDEX -- --  TROPONINI 4.78* 7.46*   CBC:  Lab 07/02/11 0500 07/01/11 0433 06/30/11 0505 06/29/11 0500 06/28/11 1239  WBC 9.5 9.1 8.6 8.2 10.8*  NEUTROABS -- -- -- -- 7.4  HGB 11.8* 12.0 12.8 12.6 13.7  HCT 34.3* 35.0* 37.3 37.1 39.8  MCV 87.1 87.3 86.9 88.1 88.4  PLT 192 203 190 209 233       ASSESSMENT AND PLAN:  Patient Active Hospital Problem List: Acute on chronic systolic CHF (congestive heart failure) (06/28/2011)     HYPERTENSION (07/28/2006)  *   Ischemic cardiomyopathy (05/14/2006)  ** HYPOTHYROIDISM (01/23/2009)   NSTEMI (non-ST elevated myocardial infarction) (05/04/2011)   Ptwith severe 3Vcad and NSTEMI medical management with bb,, asa, plavix, CCB  Transfer to floor; wean Hep anticipate discharge tomorrow and will look into ranexa    Signed, Sherryl Manges  MD  07/02/2011

## 2011-07-02 NOTE — Progress Notes (Signed)
ANTICOAGULATION CONSULT NOTE - Follow Up Consult  Pharmacy Consult for heparin Indication: NSTEMI  Labs:  Basename 07/02/11 0500 07/01/11 0826 07/01/11 0433 06/30/11 0505 06/29/11 2033 06/29/11 1353 06/29/11 0942  HGB 11.8* -- 12.0 -- -- -- --  HCT 34.3* -- 35.0* 37.3 -- -- --  PLT 192 -- 203 190 -- -- --  APTT -- -- -- -- -- -- --  LABPROT -- -- -- -- -- -- --  INR -- -- -- -- -- -- --  HEPARINUNFRC 0.28* -- 0.39 0.49 -- -- --  CREATININE 0.73 0.67 0.82 -- -- -- --  CKTOTAL -- -- -- -- 264* 323* 371*  CKMB -- -- -- -- 9.6* 14.3* 20.3*  TROPONINI -- -- -- -- 4.78* 7.46* 8.47*    Assessment 78yo female therapeutic on heparin for NSTEMI with inoperable 3VD. HL 0.28-slightly subtherapeutic today. Spoke with Dr. Graciela Husbands - plan to decrease heparin to 1/2 rate for 12 hours then d/c.  Goal of therapy Heparin level= 0.3-0.7 Platelet monitoring per protocol: yes  Plan -Decrease heparin to 320 units/hr (1/2 rate) x 12 hours then d/c -Will d/c heparin levels and daily CBC  Christoper Fabian, PharmD, BCPS Clinical pharmacist, pager [phone]  07/02/2011 9:29 AM

## 2011-07-03 ENCOUNTER — Ambulatory Visit: Payer: Medicare Other | Admitting: Physician Assistant

## 2011-07-03 LAB — BASIC METABOLIC PANEL
GFR calc non Af Amer: 74 mL/min — ABNORMAL LOW (ref >90)
Glucose, Bld: 117 mg/dL — ABNORMAL HIGH (ref 70–99)
Potassium: 4.2 mEq/L (ref 3.5–5.1)
Sodium: 139 mEq/L (ref 135–145)

## 2011-07-03 MED ORDER — FUROSEMIDE 80 MG PO TABS
80.0000 mg | ORAL_TABLET | Freq: Two times a day (BID) | ORAL | Status: DC
  Administered 2011-07-03 – 2011-07-04 (×2): 80 mg via ORAL
  Filled 2011-07-03 (×4): qty 1

## 2011-07-03 NOTE — Care Management Note (Signed)
    Page 1 of 1   07/03/2011     12:54:41 PM   CARE MANAGEMENT NOTE 07/03/2011  Patient:  Lori Trujillo, Lori Trujillo   Account Number:  [id number]  Date Initiated:  07/02/2011  Documentation initiated by:  Fransico Michael  Subjective/Objective Assessment:   admitted on 06/28/11 with c/o pulmonary edema and NSTEMI     Action/Plan:   IV heparin  IV diuresis    From [facility]- [facility]. Independent with ADLs   Anticipated DC Date:  07/05/2011   Anticipated DC Plan:  HOME/SELF CARE      DC Planning Services  CM consult      Choice offered to / List presented to:             Status of service:  Completed, signed off Medicare Important Message given?   (If response is "NO", the following Medicare IM given date fields will be blank) Date Medicare IM given:   Date Additional Medicare IM given:    Discharge Disposition:  HOME/SELF CARE  Per UR Regulation:  Reviewed for med. necessity/level of care/duration of stay  If discussed at Long Length of Stay Meetings, dates discussed:    Comments:  PCP: Lori Trujillo  ConsultAmalia Trujillo (daughter) [phone]  07-03-11 1251 Tomi Bamberger, RN,BSN [phone] Pt had questions about a hoveround. CM stated that pt will need to contact PCP and the company family chooses to use for hoveround. PCP has to provide additional information. Not sure how much insurance  covers with this dme. No further needs from CM at this time.

## 2011-07-03 NOTE — Progress Notes (Signed)
CARDIAC REHAB PHASE I   PRE:  Rate/Rhythm: 68 pacing    BP: sitting 110/60    SaO2: 98 RA  MODE:  Ambulation: 160 ft   POST:  Rate/Rhythm: 72 pacing    BP: sitting 100/50     SaO2: 98 RA  Pt reluctant to walk. Sts she doesn't have as much "zip". Also she has been going to bathroom independently quite often. Steady walking with RW. No major c/o, just sts she lacks energy. Apparently daughter is getting her a rollator through hospital. Also interested in a motorized scooter. CM to discuss this with her. Reminded of low sodium. [phone]  Harriet Masson CES, ACSM

## 2011-07-03 NOTE — Progress Notes (Signed)
INITIAL ADULT NUTRITION ASSESSMENT Date: 07/03/2011   Time: 10:05 AM Reason for Assessment: Health History  ASSESSMENT: Female 78 y.o.  Dx: Acute on chronic systolic CHF (congestive heart failure)  Hx:  Past Medical History  Diagnosis Date  . Hyperlipidemia   . Hypertension   . Hypothyroidism 6/11    Thyroid ablation, s/p treatment with iodine  . Hypercalcemia   . TIA (transient ischemic attack) 2000    ?TIA; carotid u/s 1-15% and brain MRI lacunar dz  . Chronic neck pain 2002  . CAD (coronary artery disease)     a. Cath 04/2011: LM 90, LAD Critical Ca stenosis ost, LCX 95 ost, RCA 70p, R->L collats, EF 35% - med managed.  . Ischemic cardiomyopathy     a. EF 35% 04/2011 LV Gram.  . Tachy-brady syndrome 2003    Per holter, s/p pacemaker implantation in [place] (pacemaker replaced 11/11 with Medtronic Device)  . Pneumonia March 2013  . DJD (degenerative joint disease) of cervical spine 2002    MRI Cspine    Related Meds:     . amLODipine  2.5 mg Oral Daily  . antiseptic oral rinse  15 mL Mouth Rinse BID  . aspirin EC  81 mg Oral Daily  . atorvastatin  10 mg Oral q1800  . cholecalciferol  1,000 Units Oral Daily  . clopidogrel  75 mg Oral Q breakfast  . furosemide  80 mg Intravenous BID  . levothyroxine  100 mcg Oral Daily  . metoprolol tartrate  37.5 mg Oral BID  . potassium chloride  40 mEq Oral BID  . sodium chloride  3 mL Intravenous Q12H     Ht: 5\' 2"  (157.5 cm)  Wt: 93 lb 3.2 oz (42.275 kg) (scale A)  Ideal Wt: 50.1 kg  % Ideal Wt: 85%  Usual Wt: 100-110 lbs per pt report Wt Readings from Last 10 Encounters:  07/03/11 93 lb 3.2 oz (42.275 kg)  05/22/11 100 lb (45.36 kg)  05/10/11 106 lb 4.8 oz (48.217 kg)  05/10/11 106 lb 4.8 oz (48.217 kg)  05/03/11 103 lb (46.72 kg)  04/24/11 108 lb (48.988 kg)  04/12/11 97 lb 9.6 oz (44.271 kg)  04/10/11 99 lb (44.906 kg)  04/03/11 102 lb (46.267 kg)  12/31/10 105 lb (47.628 kg)    % Usual Wt: 93% of 100 lbs  Body  mass index is 17.05 kg/(m^2). pt is underweight   Food/Nutrition Related Hx: Pt reports normal appetite and intake. Thinks weight loss recently is from fluids.   Labs:  CMP     Component Value Date/Time   NA 139 07/03/2011 0500   K 4.2 07/03/2011 0500   CL 102 07/03/2011 0500   CO2 26 07/03/2011 0500   GLUCOSE 117* 07/03/2011 0500   BUN 23 07/03/2011 0500   CREATININE 0.76 07/03/2011 0500   CALCIUM 10.2 07/03/2011 0500   CALCIUM 10.0 11/18/2006 0000   PROT 6.8 05/03/2011 1843   ALBUMIN 3.7 05/03/2011 1843   AST 54* 05/03/2011 1843   ALT 13 05/03/2011 1843   ALKPHOS 76 05/03/2011 1843   BILITOT 0.5 05/03/2011 1843   GFRNONAA 74* 07/03/2011 0500   GFRAA 85* 07/03/2011 0500    Intake/Output Summary (Last 24 hours) at 07/03/11 1007 Last data filed at 07/03/11 0954  Gross per 24 hour  Intake 840.25 ml  Output   1975 ml  Net -1134.75 ml     Diet Order: Cardiac  Supplements/Tube Feeding: none  IVF:    heparin Last Rate:  300 Units/hr (07/02/11 0946)  nitroGLYCERIN Last Rate: Stopped (06/29/11 1200)    Estimated Nutritional Needs:   Kcal: 1200-1400  Protein: 50-60 gm  Fluid: > 1.5 L   Pt reports that she has lost weight since last admission, and additional 7 lb weight loss since last office visit, 7% body weight in 1 month, severe weight loss. Pt thinks this is related to fluids as she has had a normal appetite and intake.  RD encouraged intake as able to maintain weight. Pt denies need for supplements at this time.    NUTRITION DIAGNOSIS: -Underweight (NI-3.1).  Status: Ongoing  RELATED TO: recent increased fluid losses  AS EVIDENCE BY: BMI <18.5  MONITORING/EVALUATION(Goals): Goal: PO intake of meals to be >90% of estimated nutrition needs Monitor: PO intake, weight, labs, I/O's   EDUCATION NEEDS: -No education needs identified at this time  INTERVENTION: 1. Pt denies need for supplements at this time 2. Encourage PO intake for weight maintenance 3. RD will continue to  follow  Dietitian [phone]  DOCUMENTATION CODES Per approved criteria  -Underweight    KOWALSKI, Pierina Schuknecht MARIE 07/03/2011, 10:05 AM

## 2011-07-03 NOTE — Progress Notes (Signed)
Patient Name: Lori Trujillo      SUBJECTIVE: without chest pain  Past Medical History  Diagnosis Date  . Hyperlipidemia   . Hypertension   . Hypothyroidism 6/11    Thyroid ablation, s/p treatment with iodine  . Hypercalcemia   . TIA (transient ischemic attack) 2000    ?TIA; carotid u/s 1-15% and brain MRI lacunar dz  . Chronic neck pain 2002  . CAD (coronary artery disease)     a. Cath 04/2011: LM 90, LAD Critical Ca stenosis ost, LCX 95 ost, RCA 70p, R->L collats, EF 35% - med managed.  . Ischemic cardiomyopathy     a. EF 35% 04/2011 LV Gram.  . Tachy-brady syndrome 2003    Per holter, s/p pacemaker implantation in [place] (pacemaker replaced 11/11 with Medtronic Device)  . Pneumonia March 2013  . DJD (degenerative joint disease) of cervical spine 2002    MRI Cspine    PHYSICAL EXAM Filed Vitals:   07/02/11 1900 07/02/11 2022 07/03/11 0500 07/03/11 0918  BP: 116/46 112/55 98/47 121/72  Pulse: 60 62 62 69  Temp:  98.8 F (37.1 C) 98.4 F (36.9 C)   TempSrc:  Oral Oral   Resp:  18 18   Height:      Weight:   93 lb 3.2 oz (42.275 kg)   SpO2:  98% 98%     General appearance: alert, cooperative and appears stated age Neck: no JVD Heart: regular rate and rhythm, S1, S2 normal, no murmur, click, rub or gallop Abdomen: soft, non-tender; bowel sounds normal; no masses,  no organomegaly Skin: Skin color, texture, turgor normal. No rashes or lesions Neurologic: Alert and oriented X 3, normal strength and tone. Normal symmetric reflexes. Normal coordination and gait  TELEMETRY: Reviewed telemetry pt in nsr :    Intake/Output Summary (Last 24 hours) at 07/03/11 1402 Last data filed at 07/03/11 1302  Gross per 24 hour  Intake 840.25 ml  Output   1400 ml  Net -559.75 ml    LABS: Basic Metabolic Panel:  Lab 07/03/11 1610 07/02/11 0500 07/01/11 0826 07/01/11 0433 06/29/11 0500 06/28/11 1239  NA 139 137 135 136 139 137  K 4.2 3.7 4.4 3.0* 3.6 4.1  CL 102 99 95* 95* 100  101  CO2 26 27 28 31 24 21   GLUCOSE 117* 107* 111* 112* 112* 189*  BUN 23 19 18 20 18 19   CREATININE 0.76 0.73 0.67 0.82 0.79 0.88  CALCIUM 10.2 9.9 -- -- -- --  MG -- -- -- -- -- --  PHOS -- -- -- -- -- --   Cardiac Enzymes: No results found for this basename: CKTOTAL:3,CKMB:3,CKMBINDEX:3,TROPONINI:3 in the last 72 hours CBC:  Lab 07/02/11 0500 07/01/11 0433 06/30/11 0505 06/29/11 0500 06/28/11 1239  WBC 9.5 9.1 8.6 8.2 10.8*  NEUTROABS -- -- -- -- 7.4  HGB 11.8* 12.0 12.8 12.6 13.7  HCT 34.3* 35.0* 37.3 37.1 39.8  MCV 87.1 87.3 86.9 88.1 88.4  PLT 192 203 190 209 233       ASSESSMENT AND PLAN:  Patient Active Hospital Problem List: Acute on chronic systolic CHF (congestive heart failure) (06/28/2011)     HYPERTENSION (07/28/2006)  *   Ischemic cardiomyopathy (05/14/2006)  ** HYPOTHYROIDISM (01/23/2009)   NSTEMI (non-ST elevated myocardial infarction) (05/04/2011)   Ptwith severe 3Vcad and NSTEMI medical management with bb,, asa, plavix, CCB  Transfer to floor; wean Hep anticipate discharge tomorrow and will look into ranexa--would avoid  In absence of angina  [id number] showed no improvements in hard endpoints.     Change lasix back to po        Signed, Sherryl Manges MD  07/03/2011

## 2011-07-04 ENCOUNTER — Encounter (HOSPITAL_COMMUNITY): Payer: Self-pay | Admitting: Internal Medicine

## 2011-07-04 LAB — BASIC METABOLIC PANEL
BUN: 24 mg/dL — ABNORMAL HIGH (ref 6–23)
Chloride: 99 mEq/L (ref 96–112)
GFR calc Af Amer: 85 mL/min — ABNORMAL LOW (ref >90)
Potassium: 4.4 mEq/L (ref 3.5–5.1)

## 2011-07-04 MED ORDER — LOSARTAN POTASSIUM 25 MG PO TABS: 25.0000 mg | ORAL_TABLET | Freq: Every day | ORAL | Status: DC

## 2011-07-04 MED ORDER — ISOSORBIDE MONONITRATE 15 MG HALF TABLET
15.0000 mg | ORAL_TABLET | Freq: Every day | ORAL | Status: DC
  Filled 2011-07-04 (×2): qty 1

## 2011-07-04 MED ORDER — POTASSIUM CHLORIDE CRYS ER 20 MEQ PO TBCR: 20.0000 meq | EXTENDED_RELEASE_TABLET | Freq: Two times a day (BID) | ORAL | Status: DC

## 2011-07-04 MED ORDER — ISOSORBIDE MONONITRATE 15 MG HALF TABLET: 15.0000 mg | ORAL_TABLET | Freq: Every day | ORAL | Status: DC

## 2011-07-04 MED ORDER — FUROSEMIDE 40 MG PO TABS: 40.0000 mg | ORAL_TABLET | Freq: Two times a day (BID) | ORAL | Status: DC

## 2011-07-04 NOTE — Progress Notes (Signed)
Went over all home meds, Follow up appts. Dressed- Pt discharged per W/C  With all belongings accompanied by Nurse tech and son. Marisa Cyphers RN

## 2011-07-04 NOTE — Discharge Summary (Signed)
ELECTROPHYSIOLOGY DISCHARGE SUMMARY    Patient ID: Lori Trujillo,  MRN: [id number], DOB/AGE: 02/20/1921 78 y.o.  Admit date: 06/28/2011 Discharge date: 07/04/2011  Primary Care Physician: Willow Ora, MD Primary Cardiologist: Berton Mount, MD  Primary Discharge Diagnosis:  . CAD (coronary artery disease) with recent NSTEMI   Cath 04/2011: LM 90, ostial LAD critical stenosis, ostial LCX 95, proximal RCA 70, R->L collats, EF 35% - medical management . Ischemic cardiomyopathy  . Acute on chronic systolic CHF . Intolerance to ACEI secondary to cough  Secondary Discharge Diagnoses:  . Hyperlipidemia  . Hypertension  . Hypothyroidism  . Hypercalcemia  . TIA  . Chronic neck pain  . Tachy-brady syndrome s/p PPM implant . DJD (degenerative joint disease) of cervical spine   Procedures This Admission: None  History and Hospital Course:  Lori Trujillo is an 78 y/o female with severe 3V CAD, recent NSTEMI and an ICM, managed medically, who presented one week ago with acute on chronic systolic CHF and was hospitalized for diuresis. She had been doing well but, in the days leading up to her admission, she experienced recurrent exertional chest pain. EMS was called and she was taken [facility] ED where she was placed on BiPAP and treated for acute pulmonary edema. She was diuresed with strict I/Os. Her chest pain resolved without sequelae. She remained hemodynamically stable. She was transitioned to PO furosemide and her medical therapy optimized. ARB was added in setting of LV dysfunction (she has history or intolerance to ACEI due to cough), in addition to low dose Norvasc for antianginal and antihypertensive properties. She was continued on aspirin, Plavix, beta blocker and statin. She has been seen, examined and deemed stable for discharge by Dr. Sherryl Manges.    Discharge Vitals: Blood pressure 111/59, pulse 65, temperature 98.1 F (36.7 C), temperature source Oral, resp. rate 18, height 5\' 2"   (1.575 m), weight 91 lb 8 oz (41.504 kg), SpO2 97.00%.   Labs: Lab Results  Component Value Date   WBC 9.5 07/02/2011   HGB 11.8* 07/02/2011   HCT 34.3* 07/02/2011   MCV 87.1 07/02/2011   PLT 192 07/02/2011    Lab 07/04/11 0548  NA 138  K 4.4  CL 99  CO2 28  BUN 24*  CREATININE 0.78  CALCIUM 10.7*  PROT --  BILITOT --  ALKPHOS --  ALT --  AST --  GLUCOSE 115*   Lab Results  Component Value Date   CKTOTAL 264* 06/29/2011   CKMB 9.6* 06/29/2011   TROPONINI 4.78* 06/29/2011     Disposition:  The patient is being discharged in stable condition.  Follow-up:   Follow up with Sherryl Manges, MD on 09/03/2011. (At 11:15 AM)    Contact information:   [address] [place] [address] [phone]    Follow up with Tereso Newcomer, PA on 07/19/2011. (At 11:10 AM)    Contact information:   [address] [place] [address] [phone]   Discharge Medications:  STOP taking these medications     hydrochlorothiazide 25 MG tablet       TAKE these medications     amlodipine 2.5 MG tablet   Commonly known as: NORVASC   Take 1 tablet (2.5 mg total) by mouth daily.      aspirin EC 81 MG tablet   Take 1 tablet (81 mg total) by mouth daily.      cholecalciferol 1000 UNITS tablet   Commonly  known as: VITAMIN D   Take 1,000 Units by mouth daily.      clopidogrel 75 MG tablet   Commonly known as: PLAVIX   Take 1 tablet (75 mg total) by mouth daily with breakfast.      furosemide 40 MG tablet   Commonly known as: LASIX   Take 1 tablet (40 mg total) by mouth 2 (two) times daily.      levothyroxine 100 MCG tablet   Commonly known as: SYNTHROID, LEVOTHROID   Take 100 mcg by mouth daily.      losartan 25 MG tablet   Commonly known as: COZAAR   Take 1 tablet (25 mg total) by mouth daily.      metoprolol tartrate 25 MG tablet   Commonly known as: LOPRESSOR   Take 1.5 tablets (37.5 mg total) by mouth 2 (two) times  daily.      nitroglycerin 0.4 MG SL tablet   Commonly known as: NITROSTAT   Place 0.4 mg under the tongue every 5 (five) minutes as needed. For chest pain      potassium chloride SA 20 MEQ tablet   Commonly known as: K-DUR,KLOR-CON   Take 1 tablet (20 mEq total) by mouth 2 (two) times daily.      rosuvastatin 5 MG tablet   Commonly known as: CRESTOR   Take 5 mg by mouth 2 (two) times a week. Take on Tuesdays and Saturdays.      Duration of Discharge Encounter: Greater than 30 minutes including physician time.  Signed, Rick Duff, PA-C 07/04/2011, 9:18 AM

## 2011-07-04 NOTE — Progress Notes (Signed)
Patient Name: Lori Trujillo      SUBJECTIVE: without chest pain; still with sputum--recurrent home issue Ambulating without difficulty  Past Medical History  Diagnosis Date  . Hyperlipidemia   . Hypertension   . Hypothyroidism 6/11    Thyroid ablation, s/p treatment with iodine  . Hypercalcemia   . TIA (transient ischemic attack) 2000    ?TIA; carotid u/s 1-15% and brain MRI lacunar dz  . Chronic neck pain 2002  . CAD (coronary artery disease)     a. Cath 04/2011: LM 90, LAD Critical Ca stenosis ost, LCX 95 ost, RCA 70p, R->L collats, EF 35% - med managed.  . Ischemic cardiomyopathy     a. EF 35% 04/2011 LV Gram.  . Tachy-brady syndrome 2003    Per holter, s/p pacemaker implantation in [place] (pacemaker replaced 11/11 with Medtronic Device)  . Pneumonia March 2013  . DJD (degenerative joint disease) of cervical spine 2002    MRI Cspine    PHYSICAL EXAM Filed Vitals:   07/03/11 1814 07/03/11 2213 07/03/11 2235 07/04/11 0719  BP: 116/64 117/56 113/60 111/59  Pulse: 69 61 62 65  Temp:  98.4 F (36.9 C)  98.1 F (36.7 C)  TempSrc:  Oral  Oral  Resp:  18    Height:      Weight:    91 lb 8 oz (41.504 kg)  SpO2:  98%  97%    General appearance: alert, cooperative and appears stated age Neck: JVP 7-8 Heart: regular rate and rhythm, S1, S2 normal, no murmur, click, rub or gallop Abdomen: soft, non-tender; bowel sounds normal; no masses,  no organomegaly Skin: Skin color, texture, turgor normal. No rashes or lesions Neurologic: Alert and oriented X 3, normal strength and tone. Normal symmetric reflexes. Normal coordination and gait  TELEMETRY: Reviewed telemetry pt in nsr :    Intake/Output Summary (Last 24 hours) at 07/04/11 0809 Last data filed at 07/03/11 1818  Gross per 24 hour  Intake    720 ml  Output    425 ml  Net    295 ml    LABS: Basic Metabolic Panel:  Lab 07/04/11 4540 07/03/11 0500 07/02/11 0500 07/01/11 0826 07/01/11 0433 06/29/11 0500 06/28/11 1239   NA 138 139 137 135 136 139 137  K 4.4 4.2 3.7 4.4 3.0* 3.6 4.1  CL 99 102 99 95* 95* 100 101  CO2 28 26 27 28 31 24 21   GLUCOSE 115* 117* 107* 111* 112* 112* 189*  BUN 24* 23 19 18 20 18 19   CREATININE 0.78 0.76 0.73 0.67 0.82 0.79 0.88  CALCIUM 10.7* 10.2 -- -- -- -- --  MG -- -- -- -- -- -- --  PHOS -- -- -- -- -- -- --   Cardiac Enzymes: No results found for this basename: CKTOTAL:3,CKMB:3,CKMBINDEX:3,TROPONINI:3 in the last 72 hours CBC:  Lab 07/02/11 0500 07/01/11 0433 06/30/11 0505 06/29/11 0500 06/28/11 1239  WBC 9.5 9.1 8.6 8.2 10.8*  NEUTROABS -- -- -- -- 7.4  HGB 11.8* 12.0 12.8 12.6 13.7  HCT 34.3* 35.0* 37.3 37.1 39.8  MCV 87.1 87.3 86.9 88.1 88.4  PLT 192 203 190 209 233       ASSESSMENT AND PLAN:  Patient Active Hospital Problem List: Acute on chronic systolic CHF (congestive heart failure) (06/28/2011)     HYPERTENSION (07/28/2006)  *   Ischemic cardiomyopathy (05/14/2006)  ** HYPOTHYROIDISM (01/23/2009)   NSTEMI (non-ST elevated myocardial infarction) (05/04/2011)   Ptwith severe 3Vcad and NSTEMI  medical management with bb,, asa, plavix, CCB  Add low dose imdur  F/u SW 2-3 weeks F/u SK 6 wks          Signed, Sherryl Manges MD  07/04/2011

## 2011-07-12 DIAGNOSIS — I2510 Atherosclerotic heart disease of native coronary artery without angina pectoris: Secondary | ICD-10-CM

## 2011-07-19 ENCOUNTER — Encounter: Payer: Self-pay | Admitting: Physician Assistant

## 2011-07-19 ENCOUNTER — Telehealth: Payer: Self-pay | Admitting: Physician Assistant

## 2011-07-19 ENCOUNTER — Ambulatory Visit (INDEPENDENT_AMBULATORY_CARE_PROVIDER_SITE_OTHER): Payer: Medicare Other | Admitting: Physician Assistant

## 2011-07-19 ENCOUNTER — Telehealth: Payer: Self-pay | Admitting: *Deleted

## 2011-07-19 VITALS — BP 92/60 | HR 62 | Ht 60.0 in | Wt 94.0 lb

## 2011-07-19 DIAGNOSIS — I10 Essential (primary) hypertension: Secondary | ICD-10-CM

## 2011-07-19 DIAGNOSIS — I208 Other forms of angina pectoris: Secondary | ICD-10-CM

## 2011-07-19 DIAGNOSIS — I5022 Chronic systolic (congestive) heart failure: Secondary | ICD-10-CM

## 2011-07-19 DIAGNOSIS — I209 Angina pectoris, unspecified: Secondary | ICD-10-CM

## 2011-07-19 DIAGNOSIS — I2589 Other forms of chronic ischemic heart disease: Secondary | ICD-10-CM

## 2011-07-19 DIAGNOSIS — E785 Hyperlipidemia, unspecified: Secondary | ICD-10-CM

## 2011-07-19 DIAGNOSIS — R609 Edema, unspecified: Secondary | ICD-10-CM

## 2011-07-19 DIAGNOSIS — I2510 Atherosclerotic heart disease of native coronary artery without angina pectoris: Secondary | ICD-10-CM

## 2011-07-19 DIAGNOSIS — I255 Ischemic cardiomyopathy: Secondary | ICD-10-CM

## 2011-07-19 LAB — BASIC METABOLIC PANEL
GFR: 57.86 mL/min — ABNORMAL LOW (ref >60.00)
Potassium: 4 mEq/L (ref 3.5–5.1)
Sodium: 138 mEq/L (ref 135–145)

## 2011-07-19 MED ORDER — RANOLAZINE ER 500 MG PO TB12: 500.0000 mg | ORAL_TABLET | Freq: Two times a day (BID) | ORAL | Status: DC

## 2011-07-19 NOTE — Telephone Encounter (Signed)
Tell patient I reviewed everything with Dr. Sherryl Manges. We would like to start Ranexa 500 mg BID. She needs an EKG one week after starting. This is to help with her chest pressure - hopefully make it go away. Tereso Newcomer, PA-C  4:34 PM 07/19/2011

## 2011-07-19 NOTE — Telephone Encounter (Signed)
pt notified of needing to start Ranexa 500 mg bid, rx sent tonight to [facility] will come in 7/8/ for ekg with the RN, pt aware 

## 2011-07-19 NOTE — Telephone Encounter (Signed)
pt notified of lab results today, repeat bmet 7/8 with RN visit for ekg same day

## 2011-07-19 NOTE — Progress Notes (Signed)
[address] [place], [place]  [address] Phone: [phone] Fax:  [phone]  Date:  07/19/2011   Name:  Lori Trujillo   DOB:  1921-07-17   MRN:  [id number]  PCP:  Willow Ora, MD  Primary Cardiologist/Primary Electrophysiologist:  Dr. Sherryl Manges    History of Present Illness: Lori Trujillo is a 78 y.o. female who returns for post hospital follow up.  She has a history of sick sinus syndrome, status post pacemaker, HTN, HL, hypothyroidism, TIA, hypercalcemia, DJD and diastolic dysfunction.  She had an abnormal Myoview earlier this year.  She was then admitted in 04/2011 with a NSTEMI.  This demonstrated critical distal left main disease extending into the LAD and circumflex, total occlusion of the LAD with right to left collaterals and moderate diffuse RCA stenosis.  Ejection fraction was 35%.  Medical management has been pursued.  She was readmitted 6/7-6/13 with acute pulmonary edema in the setting of NSTEMI.  She was diuresed.  Medical therapy was adjusted.  She is here with her son today.  She has central chest pressure with activity.  This is improved since her symptoms prior to admission.  No syncope.  She reports Class 2b-3 DOE.  No orthopnea, PND, edema.  Weights stable at home.    Wt Readings from Last 3 Encounters:  07/19/11 94 lb (42.638 kg)  07/04/11 91 lb 8 oz (41.504 kg)  05/22/11 100 lb (45.36 kg)     Potassium  Date/Time Value Range Status  07/19/2011 12:14 PM 4.0  3.5 - 5.1 mEq/L Final     Creatinine, Ser  Date/Time Value Range Status  07/19/2011 12:14 PM 1.1  0.4 - 1.2 mg/dL Final     ALT  Date/Time Value Range Status  05/03/2011  6:43 PM 13  0 - 35 U/L Final     TSH  Date/Time Value Range Status  05/03/2011  6:43 PM 0.886  0.350 - 4.500 uIU/mL Final     Hemoglobin  Date/Time Value Range Status  07/02/2011  5:00 AM 11.8* 12.0 - 15.0 g/dL Final    Past Medical History  Diagnosis Date  . Hyperlipidemia   . Hypertension   .  Hypothyroidism 6/11    Thyroid ablation, s/p treatment with iodine  . Hypercalcemia   . TIA (transient ischemic attack) 2000    ?TIA; carotid u/s 1-15% and brain MRI lacunar dz  . Chronic neck pain 2002  . CAD (coronary artery disease)     a. Cath 04/2011: LM 90, LAD Critical Ca stenosis ost, LCX 95 ost, RCA 70p, R->L collats,   med managed. b) NSTEM 6/13  . Ischemic cardiomyopathy     a. EF 35% 04/2011 LV Gram.  . Tachy-brady syndrome 2003    Per holter, s/p pacemaker implantation in [place] (pacemaker replaced 11/11 with Medtronic Device)  . Pneumonia March 2013  . DJD (degenerative joint disease) of cervical spine 2002    MRI Cspine  . ACE inhibitor intolerance     Current Outpatient Prescriptions  Medication Sig Dispense Refill  . amLODipine (NORVASC) 2.5 MG tablet Take 1 tablet (2.5 mg total) by mouth daily.  30 tablet  11  . aspirin EC 81 MG tablet Take 1 tablet (81 mg total) by mouth daily.  30 tablet    . cholecalciferol (VITAMIN D) 1000 UNITS tablet Take 1,000 Units by mouth daily.        . clopidogrel (PLAVIX) 75 MG tablet Take 1 tablet (75 mg total)  by mouth daily with breakfast.  30 tablet  11  . furosemide (LASIX) 40 MG tablet Take 1 tablet (40 mg total) by mouth 2 (two) times daily.  60 tablet  3  . levothyroxine (SYNTHROID, LEVOTHROID) 100 MCG tablet Take 100 mcg by mouth daily.      Marland Kitchen losartan (COZAAR) 25 MG tablet Take 1 tablet (25 mg total) by mouth daily.  30 tablet  3  . metoprolol tartrate (LOPRESSOR) 25 MG tablet Take 1.5 tablets (37.5 mg total) by mouth 2 (two) times daily.  90 tablet  11  . nitroGLYCERIN (NITROSTAT) 0.4 MG SL tablet Place 0.4 mg under the tongue every 5 (five) minutes as needed. For chest pain      . potassium chloride SA (K-DUR,KLOR-CON) 20 MEQ tablet Take 1 tablet (20 mEq total) by mouth 2 (two) times daily.  60 tablet  3  . rosuvastatin (CRESTOR) 5 MG tablet Take 5 mg by mouth 2 (two) times a week. Take on Tuesdays and Saturdays.      Marland Kitchen DISCONTD:  benazepril (LOTENSIN) 40 MG tablet Take 1 tablet (40 mg total) by mouth daily.  30 tablet  6    Allergies: Allergies  Allergen Reactions  . Simvastatin     unknown    History  Substance Use Topics  . Smoking status: Former Smoker -- 0.2 packs/day    Quit date: 01/21/1973  . Smokeless tobacco: Not on file  . Alcohol Use: No     ROS:  Please see the history of present illness.   + Constipation.  All other systems reviewed and negative.   PHYSICAL EXAM: VS:  BP 92/60  Pulse 62  Ht 5' (1.524 m)  Wt 94 lb (42.638 kg)  BMI 18.36 kg/m2 Well nourished, well developed, in no acute distress HEENT: normal Neck: no JVD Cardiac:  normal S1, S2; RRR; no murmur Lungs:  Decreased breath sounds bilaterally, no wheezing, rhonchi or rales Abd: soft, nontender, no hepatomegaly Ext: no edema Skin: warm and dry Neuro:  CNs 2-12 intact, no focal abnormalities noted  EKG:  A paced, HR 62, LVH, repol abnormality, QTc 424 ms   ASSESSMENT AND PLAN:  1.  Chronic Stable Angina She is having fairly stable Class III Angina. Her BP is too soft to tolerate adjusting her medications further. I reviewed her chart.  Dr. Graciela Husbands did not want to start Ranexa. I reviewed this with him after the patient left the office. There is some concern about starting this in the setting of ACS due to potential to cause QT prolongation. After review with Dr. Graciela Husbands, we felt it was probably ok to try this drug now that she is 3 weeks out from her event. We will contact the patient and have her try Ranexa 500 mg BID with an EKG one week later.  2.  3 Vessel Coronary Artery Disease Not a surgical candidate. Continue current Rx. Follow up with Dr. Sherryl Manges in August as planned.  3.  Ischemic Cardiomyopathy She is on a good regimen with ARB, beta blocker, furosemide. BP too soft to advance Rx further.  4.  Chronic Systolic CHF Volume stable. Check BMET today.  5.  Hypertension Controlled.  6.   Hyperlipidemia Continue Crestor.   [name], PA-C  11:38 AM 07/19/2011

## 2011-07-19 NOTE — Telephone Encounter (Signed)
pt notified of needing to start Ranexa 500 mg bid, rx sent tonight to [facility] will come in 7/8/ for ekg with the RN, pt aware

## 2011-07-19 NOTE — Telephone Encounter (Signed)
Message copied by Tarri Fuller on Fri Jul 19, 2011  5:38 PM ------      Message from: [place], [place] T      Created: Fri Jul 19, 2011  5:12 PM       BUN up - may be getting a little dry.      Change Lasix and K+ to once daily.      Weigh daily and call if:  Weight up 2-3 lbs in one day, increased swelling or increased dyspnea.       Check BMET in one week.      Tereso Newcomer, PA-C  5:12 PM 07/19/2011

## 2011-07-19 NOTE — Patient Instructions (Addendum)
Your physician recommends that you schedule a follow-up appointment in: 09/03/11 WITH DR. Graciela Husbands  Your physician recommends that you return for lab work in: TODAY BMET  NO CHANGES WITH MEDICATIONS  PLEASE SCHEDULE TO HAVE CAROTID DUPLEX DONE DX CAD

## 2011-07-29 ENCOUNTER — Ambulatory Visit (INDEPENDENT_AMBULATORY_CARE_PROVIDER_SITE_OTHER): Payer: Medicare Other | Admitting: *Deleted

## 2011-07-29 ENCOUNTER — Other Ambulatory Visit (INDEPENDENT_AMBULATORY_CARE_PROVIDER_SITE_OTHER): Payer: Medicare Other

## 2011-07-29 DIAGNOSIS — R609 Edema, unspecified: Secondary | ICD-10-CM

## 2011-07-29 DIAGNOSIS — I2510 Atherosclerotic heart disease of native coronary artery without angina pectoris: Secondary | ICD-10-CM

## 2011-07-29 DIAGNOSIS — Z79899 Other long term (current) drug therapy: Secondary | ICD-10-CM

## 2011-07-29 DIAGNOSIS — I10 Essential (primary) hypertension: Secondary | ICD-10-CM

## 2011-07-29 LAB — BASIC METABOLIC PANEL
CO2: 29 mEq/L (ref 19–32)
Chloride: 100 mEq/L (ref 96–112)
Potassium: 3.4 mEq/L — ABNORMAL LOW (ref 3.5–5.1)
Sodium: 136 mEq/L (ref 135–145)

## 2011-07-30 ENCOUNTER — Telehealth: Payer: Self-pay | Admitting: *Deleted

## 2011-07-30 DIAGNOSIS — R609 Edema, unspecified: Secondary | ICD-10-CM

## 2011-07-30 DIAGNOSIS — I10 Essential (primary) hypertension: Secondary | ICD-10-CM

## 2011-07-30 NOTE — Telephone Encounter (Signed)
Message copied by Tarri Fuller on Tue Jul 30, 2011  3:49 PM ------      Message from: [place], [place] T      Created: Mon Jul 29, 2011 10:30 PM       K+ low.      Creatinine higher.      Take extra K+ 20 mEq x 1.      Decrease Lasix to 20 mg QD.      Weigh daily and call if:  Weight up 2-3 lbs in one day, increased swelling or increased dyspnea.       Repeat BMET in one week.      Tereso Newcomer, PA-C  10:30 PM 07/29/2011

## 2011-07-30 NOTE — Telephone Encounter (Signed)
pt notified of lab results and med dose changes, decrease lasix to 20 mg daily and take extra K+ today, will have repeat bmet 7/16

## 2011-08-05 ENCOUNTER — Encounter (INDEPENDENT_AMBULATORY_CARE_PROVIDER_SITE_OTHER): Payer: Medicare Other

## 2011-08-05 ENCOUNTER — Other Ambulatory Visit (INDEPENDENT_AMBULATORY_CARE_PROVIDER_SITE_OTHER): Payer: Medicare Other

## 2011-08-05 DIAGNOSIS — I10 Essential (primary) hypertension: Secondary | ICD-10-CM

## 2011-08-05 DIAGNOSIS — R609 Edema, unspecified: Secondary | ICD-10-CM

## 2011-08-05 DIAGNOSIS — I2510 Atherosclerotic heart disease of native coronary artery without angina pectoris: Secondary | ICD-10-CM

## 2011-08-05 DIAGNOSIS — I6529 Occlusion and stenosis of unspecified carotid artery: Secondary | ICD-10-CM

## 2011-08-05 LAB — BASIC METABOLIC PANEL
BUN: 24 mg/dL — ABNORMAL HIGH (ref 6–23)
Chloride: 103 mEq/L (ref 96–112)
Glucose, Bld: 102 mg/dL — ABNORMAL HIGH (ref 70–99)
Potassium: 3.9 mEq/L (ref 3.5–5.1)

## 2011-08-06 ENCOUNTER — Other Ambulatory Visit: Payer: Medicare Other

## 2011-08-06 ENCOUNTER — Telehealth: Payer: Self-pay | Admitting: *Deleted

## 2011-08-06 NOTE — Telephone Encounter (Signed)
Message copied by Tarri Fuller on Tue Aug 06, 2011  8:47 AM ------      Message from: [place], [place] T      Created: Mon Aug 05, 2011  5:54 PM       Creatinine stable      Tereso Newcomer, [place]  5:54 PM 08/05/2011

## 2011-08-06 NOTE — Telephone Encounter (Signed)
pt notified of lab results. pt asked me if she was to stay on K+ qd or bid and I told her she is to stay on qd, she was only supposed to take an extra 20 meq x 1, pt gave me verbal understanding today

## 2011-08-08 ENCOUNTER — Telehealth: Payer: Self-pay | Admitting: Internal Medicine

## 2011-08-08 ENCOUNTER — Encounter: Payer: Medicare Other | Admitting: *Deleted

## 2011-08-08 DIAGNOSIS — I2510 Atherosclerotic heart disease of native coronary artery without angina pectoris: Secondary | ICD-10-CM

## 2011-08-08 NOTE — Telephone Encounter (Signed)
PT WAS GIVEN SAMPLES OF CRESTOR, WANTS TO KNOW IF SHE NOW NEEDS AN RX? IF SO USES [facility] CALL PT [phone]

## 2011-08-08 NOTE — Telephone Encounter (Signed)
Will forward to Sherri Rad Rn to discuss with Dr Graciela Husbands.

## 2011-08-09 ENCOUNTER — Ambulatory Visit (INDEPENDENT_AMBULATORY_CARE_PROVIDER_SITE_OTHER): Payer: Medicare Other | Admitting: Internal Medicine

## 2011-08-09 ENCOUNTER — Encounter: Payer: Self-pay | Admitting: Internal Medicine

## 2011-08-09 VITALS — BP 132/74 | HR 78 | Temp 98.1°F | Wt 95.0 lb

## 2011-08-09 DIAGNOSIS — E039 Hypothyroidism, unspecified: Secondary | ICD-10-CM

## 2011-08-09 DIAGNOSIS — I10 Essential (primary) hypertension: Secondary | ICD-10-CM

## 2011-08-09 DIAGNOSIS — I2510 Atherosclerotic heart disease of native coronary artery without angina pectoris: Secondary | ICD-10-CM

## 2011-08-09 DIAGNOSIS — E785 Hyperlipidemia, unspecified: Secondary | ICD-10-CM

## 2011-08-09 LAB — LDL CHOLESTEROL, DIRECT: Direct LDL: 105 mg/dL

## 2011-08-09 LAB — ALT: ALT: 11 U/L (ref 0–35)

## 2011-08-09 LAB — LIPID PANEL
HDL: 92 mg/dL (ref >39.00)
Total CHOL/HDL Ratio: 2
Triglycerides: 83 mg/dL (ref 0.0–149.0)

## 2011-08-09 MED ORDER — ROSUVASTATIN CALCIUM 5 MG PO TABS: 5.0000 mg | ORAL_TABLET | ORAL | Status: DC

## 2011-08-09 NOTE — Telephone Encounter (Signed)
RX sent to [facility] on [address] for Crestor 5 mg twice weekly. I attempted to call the patient and leave a message. Here phone beeped after about 10 rings. I am not sure if my message recorded.

## 2011-08-09 NOTE — Progress Notes (Signed)
  Subjective:    Patient ID: Lori Trujillo, female    DOB: 17-Apr-1921, 78 y.o.   MRN: [id number]  HPI Since the last time I saw her3-2013, she had a cardiac catheterization in April and a MI last month. Despite that, she is doing remarkably well. Chart , labs and current medications reviewed. Hyperlipidemia, taking Crestor twice a week, good tolerance. Hypertension, ambulatory BPs around 130/70. Hypothyroidism, good medication compliance.   Past Medical History:  Hyperlipidemia  Hypertension  hypothyroidism, status post treatment with iodine 06/2009  CV --tachy brady syndrome per holter 2003, s/p pacemaker implantation 2003 in [place]--- pacemaker replaced 11-2009  --CAD, CATH Cath 04/2011: LM 90, LAD Critical Ca stenosis ost, LCX 95 ost, RCA 70p, R->L collats, med managed.  --NSTEM 6/13  --Ischemic cardiomyopathy   --2000 ?TIA had a Carotid u/s 1-15% and a brain MRI lacunar dz  h/o HYPERCALCEMIA  2002: chronic neck pain MRI Cspine: DJD     Past Surgical History:  Hysterectomy  Tonsillectomy   Social History:  lives by self, independent on her ADL , sometimes has a difficult time getting in and out of the shower  not driving anymore  widow  son died from a MI 06-15-22 , son and a daughter in [place]  tobacco-- quit ~2000, used to smoke 1/4 ppd x years    Review of Systems Currently denies chest pain or shortness of breath. No lower extremity edema. Ranexa   seemed to help the most according to the patient. No nausea, vomiting, diarrhea. No blood in the stools. She has not been very active lately but likes to go back to do some routine exercise.     Objective:   Physical Exam General -- alert, well-developed, and no overweight appearing. No apparent distress.  Lungs -- normal respiratory effort, no intercostal retractions, no accessory muscle use, and  decreased breath sounds.   Heart-- normal rate, regular rhythm, no murmur, and no gallop.   Extremities-- no pretibial edema  bilaterally  Neurologic-- alert & oriented X3 and strength normal in all extremities. Psych--  Alert and cooperative with normal attention span and concentration.  not anxious appearing and not depressed appearing.      Assessment & Plan:

## 2011-08-09 NOTE — Assessment & Plan Note (Signed)
Well controlled 

## 2011-08-09 NOTE — Patient Instructions (Signed)
Next visit in 4-5 months. Please call when you need more samples of Crestor.

## 2011-08-09 NOTE — Assessment & Plan Note (Signed)
Last TSH normal.  °

## 2011-08-09 NOTE — Assessment & Plan Note (Signed)
Currently on Crestor 5 mg twice a week. Cost is a major issue, plenty of samples provided. Will call when needs  more samples. Plan-- cholesterol panel.

## 2011-08-09 NOTE — Assessment & Plan Note (Signed)
Doing remarkably well with current regimen. No change. Patient likes to go back to some physical activity, I think is fine as long as she does it gradually. If  has chest pain or difficulty breathing she needs to stop and let us know

## 2011-08-12 ENCOUNTER — Encounter: Payer: Self-pay | Admitting: *Deleted

## 2011-08-16 ENCOUNTER — Encounter: Payer: Self-pay | Admitting: *Deleted

## 2011-08-21 ENCOUNTER — Inpatient Hospital Stay (HOSPITAL_COMMUNITY)
Admission: EM | Admit: 2011-08-21 | Discharge: 2011-08-27 | DRG: 280 | Disposition: A | Discharge status: Hospice | Payer: Medicare Other | Attending: Cardiology | Admitting: Cardiology

## 2011-08-21 ENCOUNTER — Encounter (HOSPITAL_COMMUNITY): Payer: Self-pay | Admitting: *Deleted

## 2011-08-21 ENCOUNTER — Emergency Department (HOSPITAL_COMMUNITY): Payer: Medicare Other

## 2011-08-21 DIAGNOSIS — Z87891 Personal history of nicotine dependence: Secondary | ICD-10-CM

## 2011-08-21 DIAGNOSIS — I495 Sick sinus syndrome: Secondary | ICD-10-CM | POA: Diagnosis present

## 2011-08-21 DIAGNOSIS — I5023 Acute on chronic systolic (congestive) heart failure: Secondary | ICD-10-CM | POA: Diagnosis present

## 2011-08-21 DIAGNOSIS — I5022 Chronic systolic (congestive) heart failure: Secondary | ICD-10-CM

## 2011-08-21 DIAGNOSIS — Z7982 Long term (current) use of aspirin: Secondary | ICD-10-CM

## 2011-08-21 DIAGNOSIS — R296 Repeated falls: Secondary | ICD-10-CM | POA: Diagnosis present

## 2011-08-21 DIAGNOSIS — I2589 Other forms of chronic ischemic heart disease: Secondary | ICD-10-CM | POA: Diagnosis present

## 2011-08-21 DIAGNOSIS — I255 Ischemic cardiomyopathy: Secondary | ICD-10-CM

## 2011-08-21 DIAGNOSIS — Z950 Presence of cardiac pacemaker: Secondary | ICD-10-CM | POA: Diagnosis present

## 2011-08-21 DIAGNOSIS — I509 Heart failure, unspecified: Secondary | ICD-10-CM | POA: Diagnosis present

## 2011-08-21 DIAGNOSIS — I252 Old myocardial infarction: Secondary | ICD-10-CM

## 2011-08-21 DIAGNOSIS — I2510 Atherosclerotic heart disease of native coronary artery without angina pectoris: Secondary | ICD-10-CM | POA: Diagnosis present

## 2011-08-21 DIAGNOSIS — I209 Angina pectoris, unspecified: Secondary | ICD-10-CM

## 2011-08-21 DIAGNOSIS — I10 Essential (primary) hypertension: Secondary | ICD-10-CM | POA: Diagnosis present

## 2011-08-21 DIAGNOSIS — I208 Other forms of angina pectoris: Secondary | ICD-10-CM

## 2011-08-21 DIAGNOSIS — E785 Hyperlipidemia, unspecified: Secondary | ICD-10-CM | POA: Diagnosis present

## 2011-08-21 DIAGNOSIS — I214 Non-ST elevation (NSTEMI) myocardial infarction: Principal | ICD-10-CM | POA: Diagnosis present

## 2011-08-21 DIAGNOSIS — Z515 Encounter for palliative care: Secondary | ICD-10-CM

## 2011-08-21 DIAGNOSIS — Y998 Other external cause status: Secondary | ICD-10-CM

## 2011-08-21 DIAGNOSIS — J4489 Other specified chronic obstructive pulmonary disease: Secondary | ICD-10-CM | POA: Diagnosis present

## 2011-08-21 DIAGNOSIS — E039 Hypothyroidism, unspecified: Secondary | ICD-10-CM | POA: Diagnosis present

## 2011-08-21 DIAGNOSIS — Z8673 Personal history of transient ischemic attack (TIA), and cerebral infarction without residual deficits: Secondary | ICD-10-CM

## 2011-08-21 DIAGNOSIS — Z66 Do not resuscitate: Secondary | ICD-10-CM | POA: Diagnosis present

## 2011-08-21 DIAGNOSIS — J449 Chronic obstructive pulmonary disease, unspecified: Secondary | ICD-10-CM

## 2011-08-21 DIAGNOSIS — R609 Edema, unspecified: Secondary | ICD-10-CM

## 2011-08-21 DIAGNOSIS — Z7902 Long term (current) use of antithrombotics/antiplatelets: Secondary | ICD-10-CM

## 2011-08-21 DIAGNOSIS — I200 Unstable angina: Secondary | ICD-10-CM | POA: Diagnosis present

## 2011-08-21 HISTORY — DX: Presence of cardiac pacemaker: Z95.0

## 2011-08-21 HISTORY — DX: Shortness of breath: R06.02

## 2011-08-21 HISTORY — DX: Angina pectoris, unspecified: I20.9

## 2011-08-21 HISTORY — DX: Heart failure, unspecified: I50.9

## 2011-08-21 HISTORY — DX: Acute myocardial infarction, unspecified: I21.9

## 2011-08-21 LAB — IRON AND TIBC
Iron: 19 ug/dL — ABNORMAL LOW (ref 42–135)
TIBC: 303 ug/dL (ref 250–470)
UIBC: 284 ug/dL (ref 125–400)

## 2011-08-21 LAB — CBC WITH DIFFERENTIAL/PLATELET
Basophils Absolute: 0 10*3/uL (ref 0.0–0.1)
Basophils Relative: 0 % (ref 0–1)
Eosinophils Absolute: 0 10*3/uL (ref 0.0–0.7)
HCT: 31.1 % — ABNORMAL LOW (ref 36.0–46.0)
MCH: 30.1 pg (ref 26.0–34.0)
MCHC: 34.1 g/dL (ref 30.0–36.0)
Monocytes Absolute: 0.8 10*3/uL (ref 0.1–1.0)
Neutro Abs: 10 10*3/uL — ABNORMAL HIGH (ref 1.7–7.7)
RDW: 16.3 % — ABNORMAL HIGH (ref 11.5–15.5)

## 2011-08-21 LAB — CARDIAC PANEL(CRET KIN+CKTOT+MB+TROPI)
CK, MB: 23.1 ng/mL (ref 0.3–4.0)
Relative Index: 4.2 — ABNORMAL HIGH (ref 0.0–2.5)
Troponin I: 1.09 ng/mL (ref <0.30)
Troponin I: >20 ng/mL (ref <0.30)
Troponin I: >20 ng/mL (ref <0.30)

## 2011-08-21 LAB — POCT I-STAT, CHEM 8
BUN: 27 mg/dL — ABNORMAL HIGH (ref 6–23)
Calcium, Ion: 1.29 mmol/L (ref 1.13–1.30)
Chloride: 107 mEq/L (ref 96–112)
Glucose, Bld: 187 mg/dL — ABNORMAL HIGH (ref 70–99)
HCT: 34 % — ABNORMAL LOW (ref 36.0–46.0)
Potassium: 4.8 mEq/L (ref 3.5–5.1)

## 2011-08-21 LAB — CBC
HCT: 31.3 % — ABNORMAL LOW (ref 36.0–46.0)
MCV: 87.7 fL (ref 78.0–100.0)
RBC: 3.57 MIL/uL — ABNORMAL LOW (ref 3.87–5.11)
WBC: 9.3 10*3/uL (ref 4.0–10.5)

## 2011-08-21 LAB — PROTIME-INR
INR: 1.46 (ref 0.00–1.49)
Prothrombin Time: 18 seconds — ABNORMAL HIGH (ref 11.6–15.2)

## 2011-08-21 LAB — FERRITIN: Ferritin: 142 ng/mL (ref 10–291)

## 2011-08-21 LAB — CREATININE, SERUM: GFR calc Af Amer: 45 mL/min — ABNORMAL LOW (ref >90)

## 2011-08-21 LAB — RETICULOCYTES: Retic Count, Absolute: 51.2 10*3/uL (ref 19.0–186.0)

## 2011-08-21 MED ORDER — SODIUM CHLORIDE 0.9 % IJ SOLN: 3.0000 mL | INTRAMUSCULAR | Status: DC | PRN

## 2011-08-21 MED ORDER — NON FORMULARY: 5.0000 mg | Status: DC

## 2011-08-21 MED ORDER — ONDANSETRON HCL 4 MG/2ML IJ SOLN: 4.0000 mg | Freq: Four times a day (QID) | INTRAMUSCULAR | Status: DC | PRN

## 2011-08-21 MED ORDER — ASPIRIN EC 81 MG PO TBEC
81.0000 mg | DELAYED_RELEASE_TABLET | Freq: Every day | ORAL | Status: DC
  Administered 2011-08-22 – 2011-08-27 (×6): 81 mg via ORAL
  Filled 2011-08-21 (×7): qty 1

## 2011-08-21 MED ORDER — HEPARIN (PORCINE) IN NACL 100-0.45 UNIT/ML-% IJ SOLN
700.0000 [IU]/h | INTRAMUSCULAR | Status: DC
  Administered 2011-08-21: 650 [IU]/h via INTRAVENOUS
  Administered 2011-08-23: 700 [IU]/h via INTRAVENOUS
  Administered 2011-08-23: 650 [IU]/h via INTRAVENOUS
  Filled 2011-08-21 (×3): qty 250

## 2011-08-21 MED ORDER — LEVOTHYROXINE SODIUM 100 MCG PO TABS
100.0000 ug | ORAL_TABLET | Freq: Every day | ORAL | Status: DC
  Administered 2011-08-21 – 2011-08-27 (×7): 100 ug via ORAL
  Filled 2011-08-21 (×7): qty 1

## 2011-08-21 MED ORDER — VITAMIN D3 25 MCG (1000 UNIT) PO TABS
1000.0000 [IU] | ORAL_TABLET | Freq: Every day | ORAL | Status: DC
  Administered 2011-08-21 – 2011-08-27 (×7): 1000 [IU] via ORAL
  Filled 2011-08-21 (×7): qty 1

## 2011-08-21 MED ORDER — FUROSEMIDE 10 MG/ML IJ SOLN
80.0000 mg | Freq: Once | INTRAMUSCULAR | Status: AC
  Administered 2011-08-22: 80 mg via INTRAVENOUS
  Filled 2011-08-21: qty 8

## 2011-08-21 MED ORDER — ALPRAZOLAM 0.25 MG PO TABS: 0.2500 mg | ORAL_TABLET | Freq: Two times a day (BID) | ORAL | Status: DC | PRN

## 2011-08-21 MED ORDER — RANOLAZINE ER 500 MG PO TB12
500.0000 mg | ORAL_TABLET | Freq: Two times a day (BID) | ORAL | Status: DC
  Administered 2011-08-21 – 2011-08-27 (×13): 500 mg via ORAL
  Filled 2011-08-21 (×15): qty 1

## 2011-08-21 MED ORDER — ROSUVASTATIN CALCIUM 5 MG PO TABS
5.0000 mg | ORAL_TABLET | ORAL | Status: DC
  Filled 2011-08-21: qty 1

## 2011-08-21 MED ORDER — CLOPIDOGREL BISULFATE 75 MG PO TABS
75.0000 mg | ORAL_TABLET | Freq: Every day | ORAL | Status: DC
  Administered 2011-08-22 – 2011-08-27 (×6): 75 mg via ORAL
  Filled 2011-08-21 (×8): qty 1

## 2011-08-21 MED ORDER — ACETAMINOPHEN 325 MG PO TABS
650.0000 mg | ORAL_TABLET | ORAL | Status: DC | PRN
  Administered 2011-08-24: 650 mg via ORAL
  Filled 2011-08-21: qty 2

## 2011-08-21 MED ORDER — SODIUM CHLORIDE 0.9 % IJ SOLN
3.0000 mL | Freq: Two times a day (BID) | INTRAMUSCULAR | Status: DC
  Administered 2011-08-21 – 2011-08-26 (×8): 3 mL via INTRAVENOUS

## 2011-08-21 MED ORDER — FUROSEMIDE 10 MG/ML IJ SOLN
40.0000 mg | Freq: Two times a day (BID) | INTRAMUSCULAR | Status: DC
  Administered 2011-08-21 – 2011-08-25 (×9): 40 mg via INTRAVENOUS
  Filled 2011-08-21 (×12): qty 4

## 2011-08-21 MED ORDER — ENOXAPARIN SODIUM 40 MG/0.4ML ~~LOC~~ SOLN
40.0000 mg | SUBCUTANEOUS | Status: DC
  Filled 2011-08-21: qty 0.4

## 2011-08-21 MED ORDER — LOSARTAN POTASSIUM 25 MG PO TABS
25.0000 mg | ORAL_TABLET | Freq: Every day | ORAL | Status: DC
  Administered 2011-08-21 – 2011-08-24 (×4): 25 mg via ORAL
  Filled 2011-08-21 (×4): qty 1

## 2011-08-21 MED ORDER — ZOLPIDEM TARTRATE 5 MG PO TABS: 5.0000 mg | ORAL_TABLET | Freq: Every evening | ORAL | Status: DC | PRN

## 2011-08-21 MED ORDER — SODIUM CHLORIDE 0.9 % IV SOLN: 250.0000 mL | INTRAVENOUS | Status: DC | PRN

## 2011-08-21 MED ORDER — NITROGLYCERIN 0.4 MG SL SUBL: 0.4000 mg | SUBLINGUAL_TABLET | SUBLINGUAL | Status: DC | PRN

## 2011-08-21 MED ORDER — METOPROLOL TARTRATE 25 MG PO TABS
37.5000 mg | ORAL_TABLET | Freq: Two times a day (BID) | ORAL | Status: DC
  Administered 2011-08-21 – 2011-08-24 (×6): 37.5 mg via ORAL
  Filled 2011-08-21 (×7): qty 1
  Filled 2011-08-21: qty 2
  Filled 2011-08-21: qty 1

## 2011-08-21 MED ORDER — AMLODIPINE BESYLATE 2.5 MG PO TABS
2.5000 mg | ORAL_TABLET | Freq: Every day | ORAL | Status: DC
  Administered 2011-08-21 – 2011-08-22 (×2): 2.5 mg via ORAL
  Filled 2011-08-21 (×3): qty 1

## 2011-08-21 MED ORDER — LEVALBUTEROL HCL 0.63 MG/3ML IN NEBU
0.6300 mg | INHALATION_SOLUTION | Freq: Four times a day (QID) | RESPIRATORY_TRACT | Status: DC | PRN
  Administered 2011-08-22 – 2011-08-24 (×5): 0.63 mg via RESPIRATORY_TRACT
  Filled 2011-08-21 (×2): qty 3

## 2011-08-21 MED ORDER — NITROGLYCERIN IN D5W 200-5 MCG/ML-% IV SOLN
5.0000 ug/min | INTRAVENOUS | Status: DC
Start: 2011-08-21 — End: 2011-08-24

## 2011-08-21 MED ORDER — HEPARIN BOLUS VIA INFUSION
3000.0000 [IU] | Freq: Once | INTRAVENOUS | Status: AC
  Administered 2011-08-21: 3000 [IU] via INTRAVENOUS
  Filled 2011-08-21: qty 3000

## 2011-08-21 MED ORDER — NITROGLYCERIN IN D5W 200-5 MCG/ML-% IV SOLN
5.0000 ug/min | Freq: Once | INTRAVENOUS | Status: AC
  Administered 2011-08-21: 5 ug/min via INTRAVENOUS
  Filled 2011-08-21: qty 250

## 2011-08-21 MED ORDER — MAGNESIUM HYDROXIDE 400 MG/5ML PO SUSP
30.0000 mL | Freq: Every day | ORAL | Status: DC | PRN
  Administered 2011-08-21 – 2011-08-23 (×2): 30 mL via ORAL
  Filled 2011-08-21 (×2): qty 30

## 2011-08-21 NOTE — ED Notes (Signed)
Pt arrived by [facility], has been getting mid chest pains x 1-2 days, woke up this am and pain was 10/10 on pain scale. Having mild sob, took 324mg  ASA and 1 nitro pta. Iv started pta, initial bp for ems was 86/55, given NS bolus pta.

## 2011-08-21 NOTE — ED Provider Notes (Signed)
History     CSN: [id number]  Arrival date & time 08/21/11  3664   First MD Initiated Contact with Patient 08/21/11 [phone]      Chief Complaint  Patient presents with  . Chest Pain    (Consider location/radiation/quality/duration/timing/severity/associated sxs/prior treatment) Patient is a 78 y.o. female presenting with chest pain. The history is provided by the patient.  Chest Pain Primary symptoms include fatigue, shortness of breath and cough. Pertinent negatives for primary symptoms include no abdominal pain, no nausea and no vomiting.  Pertinent negatives for associated symptoms include no numbness and no weakness.    patient has had episodes of chest pressure since last night. It comes on with exertion. She states she's not been able to get up and walk around. She has an occasional nonproductive cough. She's a history of severe coronary disease. The pain goes away with rest. She's had recent admissions to the hospital for fluid in her lungs per the patient. No nausea vomiting diarrhea. No lightheadedness or dizziness. She is pain-free now. She states she has a 90% lesion that they could not do anything about because of her age.  Past Medical History  Diagnosis Date  . Hyperlipidemia   . Hypertension   . Hypothyroidism 6/11    Thyroid ablation, s/p treatment with iodine  . Hypercalcemia   . TIA (transient ischemic attack) 2000    ?TIA; carotid u/s 1-15% and brain MRI lacunar dz  . Chronic neck pain 2002  . CAD (coronary artery disease)     a. Cath 04/2011: LM 90, LAD Critical Ca stenosis ost, LCX 95 ost, RCA 70p, R->L collats,   med managed. b) NSTEM 6/13  . Ischemic cardiomyopathy     a. EF 35% 04/2011 LV Gram.  . Tachy-brady syndrome 2003    Per holter, s/p pacemaker implantation in [place] (pacemaker replaced 11/11 with Medtronic Device)  . Pneumonia March 2013  . DJD (degenerative joint disease) of cervical spine 2002    MRI Cspine  . ACE inhibitor intolerance     Past  Surgical History  Procedure Date  . Abdominal hysterectomy   . Tonsillectomy   . Cardiac catheterization 04/2011  . Insert / replace / remove pacemaker 2005    Family History  Problem Relation Age of Onset  . Cancer Neg Hx     No breast or colon  . Goiter Neg Hx     No other thyroid problems  . Heart attack Son     History  Substance Use Topics  . Smoking status: Former Smoker -- 0.2 packs/day    Quit date: 01/21/1973  . Smokeless tobacco: Not on file  . Alcohol Use: No    OB History    Grav Para Term Preterm Abortions TAB SAB Ect Mult Living                  Review of Systems  Constitutional: Positive for fatigue. Negative for activity change and appetite change.  HENT: Negative for neck stiffness.   Eyes: Negative for pain.  Respiratory: Positive for cough and shortness of breath. Negative for chest tightness.   Cardiovascular: Positive for chest pain. Negative for leg swelling.  Gastrointestinal: Negative for nausea, vomiting, abdominal pain and diarrhea.  Genitourinary: Negative for flank pain.  Musculoskeletal: Negative for back pain.  Skin: Negative for rash.  Neurological: Negative for weakness, numbness and headaches.  Psychiatric/Behavioral: Negative for behavioral problems.    Allergies  Simvastatin  Home Medications   No current outpatient  prescriptions on file.  BP 112/59  Pulse 60  Temp 98.4 F (36.9 C) (Oral)  Resp 17  Ht 5\' 2"  (1.575 m)  Wt 103 lb 9.9 oz (47 kg)  BMI 18.95 kg/m2  SpO2 97%  Physical Exam  Nursing note and vitals reviewed. Constitutional: She is oriented to person, place, and time. She appears well-developed and well-nourished.  HENT:  Head: Normocephalic and atraumatic.  Eyes: EOM are normal. Pupils are equal, round, and reactive to light.  Neck: Normal range of motion. Neck supple.  Cardiovascular: Normal rate, regular rhythm and normal heart sounds.   No murmur heard. Pulmonary/Chest: Effort normal. No respiratory  distress. She has wheezes. She has rales.       Prolonged expirations and wheezes with bilateral lower lung field rales  Abdominal: Soft. Bowel sounds are normal. She exhibits no distension. There is no tenderness. There is no rebound and no guarding.  Musculoskeletal: Normal range of motion.  Neurological: She is alert and oriented to person, place, and time. No cranial nerve deficit.  Skin: Skin is warm and dry.  Psychiatric: She has a normal mood and affect. Her speech is normal.    ED Course  Procedures (including critical care time)  Labs Reviewed  CBC WITH DIFFERENTIAL - Abnormal; Notable for the following:    WBC 11.5 (*)     RBC 3.52 (*)     Hemoglobin 10.6 (*)     HCT 31.1 (*)     RDW 16.3 (*)     Neutrophils Relative 87 (*)     Neutro Abs 10.0 (*)     Lymphocytes Relative 6 (*)     All other components within normal limits  CARDIAC PANEL(CRET KIN+CKTOT+MB+TROPI) - Abnormal; Notable for the following:    CK, MB 5.6 (*)     Troponin I 1.09 (*)     Relative Index 5.1 (*)     All other components within normal limits  PRO B NATRIURETIC PEPTIDE - Abnormal; Notable for the following:    Pro B Natriuretic peptide (BNP) 11291.0 (*)     All other components within normal limits  POCT I-STAT, CHEM 8 - Abnormal; Notable for the following:    BUN 27 (*)     Creatinine, Ser 1.30 (*)     Glucose, Bld 187 (*)     Hemoglobin 11.6 (*)     HCT 34.0 (*)     All other components within normal limits  RETICULOCYTES - Abnormal; Notable for the following:    RBC. 3.66 (*)     All other components within normal limits  VITAMIN B12  FOLATE  IRON AND TIBC  FERRITIN  MRSA PCR SCREENING  CARDIAC PANEL(CRET KIN+CKTOT+MB+TROPI)  CARDIAC PANEL(CRET KIN+CKTOT+MB+TROPI)  CARDIAC PANEL(CRET KIN+CKTOT+MB+TROPI)  TSH  CBC  CREATININE, SERUM   Dg Chest Port 1 View  08/21/2011  *RADIOLOGY REPORT*  Clinical Data: Chest pain and shortness of breath.  PORTABLE CHEST - 1 VIEW  Comparison:  06/28/2011  Findings: Semi upright portable view of the chest was obtained. Patchy airspace densities, right side greater than left.  Heart size is stable.  Atherosclerotic calcifications involving the arch. The patient has a left cardiac pacemaker.  Mild fullness in the right hilum could be related to vascular congestion.  IMPRESSION: Patchy interstitial and airspace densities suggest asymmetric pulmonary edema.  Original Report Authenticated By: Richarda Overlie, M.D.     1. Acute on chronic systolic CHF (congestive heart failure)   2. CAD (coronary artery disease)  3. Coronary artery disease-severe 3 vessel/left main   4. Essential hypertension, benign   5. Edema   6. Other and unspecified angina pectoris   7. NSTEMI (non-ST elevated myocardial infarction)      Date: 08/21/2011  Rate: 65  Rhythm: electronically paced  QRS Axis: indeterminate  Intervals: electronically paced  ST/T Wave abnormalities: electronically paced  Conduction Disutrbances:electronically paced  Narrative Interpretation:   Old EKG Reviewed: unchanged    MDM  Patient has mild chest pains for last couple days. Found to be hypertensive. She's a history of severe coronary artery disease. She also has nonoperative lesions. Patient has a slightly elevated troponin. She has CHF on x-ray. Nitroglycerin was started for CHF. Patient be admitted to cardiology.        Juliet Rude. Rubin Payor, MD 08/21/11 1550

## 2011-08-21 NOTE — Progress Notes (Signed)
CRITICAL VALUE ALERT  Critical value received:  CKMB=34.0,Tropomin=greater than 20.0  Date of notification:  08/21/2011  Time of notification:  1845  Critical value read back:yes  Nurse who received alert:  Mikey [employer]  MD notified (1st page):  Dr. Elvis Coil PA, Hurman Horn  Time of first page:  1850  MD notified (2nd page):  Time of second page:1900  Responding MD:  Park Pope  Time MD responded:  [phone]

## 2011-08-21 NOTE — Consult Note (Cosign Needed Addendum)
CARDIOLOGY CONSULT NOTE   Patient ID: Lori Trujillo MRN: [id number] DOB/AGE: Oct 18, 1921 78 y.o.  Admit date: 08/21/2011  Primary Physician   Willow Ora, MD Primary Cardiologist   SK Reason for Consultation   SOB, chest pain  WUJ:[id number] Lori Trujillo is a 78 y.o. female with a history of CAD.   Over the last 3 days, she has noted increasing shortness of breath and has had several episodes of chest pain. She has had to take nitroglycerin both Monday, Tuesday and today. Her chest pain is otherwise relieved by rest. She has been short of breath with exertion and also notes that she does not tolerate heat. She has been eating poorly but denies weight gain. She has orthopnea but denies PND. Early this a.m., she had 7/10 chest pain and shortness of breath. She came to the ER where her CXR shows pulmonary edema. Her chest pain has resolved with NTG she took at home. She fell because she was too weak and SOB to get back to the bedroom from the bathroom. She was able to call 911 and was transported by EMS to the ER. She feels better now but is still notably SOB.   Past Medical History  Diagnosis Date  . Hyperlipidemia   . Hypertension   . Hypothyroidism 6/11    Thyroid ablation, s/p treatment with iodine  . Hypercalcemia   . TIA (transient ischemic attack) 2000    ?TIA; carotid u/s 1-15% and brain MRI lacunar dz  . Chronic neck pain 2002  . CAD (coronary artery disease)     a. Cath 04/2011: LM 90, LAD Critical Ca stenosis ost, LCX 95 ost, RCA 70p, R->L collats,   med managed. b) NSTEM 6/13  . Ischemic cardiomyopathy     a. EF 35% 04/2011 LV Gram.  . Tachy-brady syndrome 2003    Per holter, s/p pacemaker implantation in [place] (pacemaker replaced 11/11 with Medtronic Device)  . Pneumonia March 2013  . DJD (degenerative joint disease) of cervical spine 2002    MRI Cspine  . ACE inhibitor intolerance      Past Surgical History  Procedure Date  . Abdominal hysterectomy   . Tonsillectomy   . Cardiac  catheterization 04/2011  . Insert / replace / remove pacemaker 2005    Allergies  Allergen Reactions  . Simvastatin     unknown    I have reviewed the patient's current medications   . nitroGLYCERIN  5 mcg/min Intravenous Once   Medication Sig  amLODipine (NORVASC) 2.5 MG tablet Take 1 tablet (2.5 mg total) by mouth daily.  aspirin EC 81 MG tablet Take 1 tablet (81 mg total) by mouth daily.  cholecalciferol (VITAMIN D) 1000 UNITS tablet Take 1,000 Units by mouth daily.    clopidogrel (PLAVIX) 75 MG tablet Take 1 tablet (75 mg total) by mouth daily with breakfast.  furosemide (LASIX) 40 MG tablet Take 0.5 tablets (20 mg total) by mouth daily.  levothyroxine (SYNTHROID, LEVOTHROID) 100 MCG tablet Take 100 mcg by mouth daily.  losartan (COZAAR) 25 MG tablet Take 1 tablet (25 mg total) by mouth daily.  metoprolol tartrate (LOPRESSOR) 25 MG tablet Take 1.5 tablets (37.5 mg total) by mouth 2 (two) times daily.  nitroGLYCERIN (NITROSTAT) 0.4 MG SL tablet Place 0.4 mg under the tongue every 5 (five) minutes as needed. For chest pain  potassium chloride SA (K-DUR,KLOR-CON) 20 MEQ tablet Take 1 tablet (20 mEq total) by mouth daily.  ranolazine (RANEXA) 500 MG 12 hr tablet  Take 1 tablet (500 mg total) by mouth 2 (two) times daily.  rosuvastatin (CRESTOR) 5 MG tablet Take 1 tablet (5 mg total) by mouth 2 (two) times a week. Take on Tuesdays and Saturdays.   History   Social History  . Marital Status: Widowed    Spouse Name: N/A    Number of Children: N/A  . Years of Education: N/A   Occupational History  . 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 Smoking status: Former Smoker -- 0.2 packs/day    Quit date: 01/21/1973  . Smokeless tobacco: Not on file  . Alcohol Use: No  . Drug Use: No  . Sexually Active: No     stopped 1970's   Other Topics Concern  . Not on file   Social History Narrative   Lives by self, independent on her ADL, sometimes has  difficult time getting in and out of shower.Not driving anymore..Son and daughter in [place].Marland KitchenMarland KitchenWas doing tai-chi till 4/13..Only child   Family History  Problem Relation Age of Onset  . Cancer Neg Hx     No breast or colon  . Goiter Neg Hx     No other thyroid problems  . Heart attack Son     Family Status  Relation Status Death Age  . Son Deceased     Died 06-19-2022 from MI  . Mother Deceased   . Father Deceased    ROS: Pt has very little ability to exert herself without SOB or chest pain. Initially, she improved on the Ranexa. She has not gained weight. She is eating poorly. She and her family feel she would benefit from a motorized wheelchair. She has not had fevers or chills. Full 14 point review of systems complete and found to be negative unless listed above.  Physical Exam: Blood pressure 119/51, pulse 63, temperature 98.3 F (36.8 C), temperature source Oral, resp. rate 19, height 5\' 2"  (1.575 m), weight 90 lb (40.824 kg), SpO2 96.00%.  General: Well developed, frail, elderly, female with moderate distress Head: Eyes PERRLA, No xanthomas.   Normocephalic and atraumatic, oropharynx without edema or exudate. Dentition - OK Lungs: bilateral basilar crackles Heart: HRRR S1 S2, no rub/gallop, no significant murmur. pulses are 2+ all 4 extrem.   Neck: No carotid bruits. No lymphadenopathy.  JVD at 12 cm. Abdomen: Bowel sounds present, abdomen soft and non-tender without masses or hernias noted. Msk:  No spine or cva tenderness. No weakness, no joint deformities or effusions. Extremities: No clubbing or cyanosis. no edema.  Neuro: Alert and oriented X 3. No focal deficits noted. Psych:  Good affect, responds appropriately Skin: No rashes or lesions noted.  Labs:   Lab Results  Component Value Date   WBC 11.5* 08/21/2011   HGB 11.6* 08/21/2011   HCT 34.0* 08/21/2011   MCV 88.4 08/21/2011   PLT 224 08/21/2011     Lab 08/21/11 0901  NA 141  K 4.8  CL 107  CO2 --  BUN 27*    CREATININE 1.30*  CALCIUM --  PROT --  BILITOT --  ALKPHOS --  ALT --  AST --  GLUCOSE 187*   Lab Results  Component Value Date   CHOL 221* 08/09/2011   HDL 92.00 08/09/2011   LDLCALC 100* 05/04/2011   TRIG 83.0 08/09/2011   Lab Results  Component Value Date   CKTOTAL 110 08/21/2011   CKMB 5.6* 08/21/2011   TROPONINI 1.09* 08/21/2011   Echo: 06/14/2009 Study Conclusions -  Left ventricle: The cavity size was normal. Wall thickness was increased in a pattern of mild LVH. There was mild focal basal hypertrophy of the septum. Systolic function was normal. The estimated ejection fraction was in the range of 60% to 65%. Wall motion was normal; there were no regional wall motion abnormalities. Doppler parameters are consistent with abnormal left ventricular relaxation (grade 1 diastolic dysfunction). - Aortic valve: Trivial regurgitation. - Mitral valve: Mildly calcified annulus. - Pulmonary arteries: Systolic pressure was mildly increased.  Cardiac Cath: 05/06/11 Dr Excell Seltzer Left mainstem: the left main is severely calcified. There is severe distal left main disease of 90%.  Left anterior descending (LAD): the LAD has critical, heavily calcified ostial stenosis. There is slow flow into the first diagonal branch and competitive flow into the mid LAD. The mid and distal LAD fill competitively from right to left collaterals.  Left circumflex (LCx): the left circumflex also has subtotal occlusion with severe 95% calcification involving the distal left mainstem. The vessel gives off 2 tiny OM branches and a moderate caliber third OM that has 60% proximal stenosis.  Right coronary artery (RCA): the RCA is severely calcified. The proximal vessel has 70% stenosis. The mid vessel is patent. The distal vessel has 50% stenosis. The PDA and posterolateral branches are diffusely diseased but patent.  Left ventriculography:there is severe hypokinesis of the distal anterior wall and akinesis of the apex.  The basal inferior and basal anterior walls contract normally. The left ventricular ejection fraction is estimated at 35%.   ECG:21-Aug-2011 07:46:46 SINUS RHYTHM ~ normal P axis, V-rate 50- 99 PROBABLE LEFT ATRIAL ABNORMALITY ~ P >10mS, <-0.31mV V1 LVH WITH SECONDARY REPOLARIZATION ABNORMALITY ~ R56L/RISIII/S12R56/S3RL & rep abn ST DEPRESSION, CONSIDER ISCHEMIA, INF LEADS ~ ST <-0.72mV, II III aVF ANTERIOR ST ELEVATION, PROBABLY DUE TO LVH ~ ST >0.20 mV in V1-V4 & LVH Standard 12 Lead Report ~ Unconfirmed Interpretation Abnormal ECG 67mm/s 74mm/mV 150Hz  8.0.1 12SL 235 CID: [id number] Referred by: Unconfirmed Vent. rate 65 BPM PR interval 180 ms QRS duration 108 ms QT/QTc 452/470 ms P-R-T axes 43 -3 192    Radiology:  Dg Chest Port 1 View 08/21/2011  *RADIOLOGY REPORT*  Clinical Data: Chest pain and shortness of breath.  PORTABLE CHEST - 1 VIEW  Comparison: 06/28/2011  Findings: Semi upright portable view of the chest was obtained. Patchy airspace densities, right side greater than left.  Heart size is stable.  Atherosclerotic calcifications involving the arch. The patient has a left cardiac pacemaker.  Mild fullness in the right hilum could be related to vascular congestion.  IMPRESSION: Patchy interstitial and airspace densities suggest asymmetric pulmonary edema.  Original Report Authenticated By: Richarda Overlie, M.D.    ASSESSMENT AND PLAN:   The patient was seen today by Dr [place], the patient evaluated and the data reviewed.  Principal Problem:  *Acute on chronic systolic CHF (congestive heart failure) - Pt has not gained weight but has volume overload by symptoms and exam. Admit, diurese, follow renal function closely. Supp K+ PRN.   Active Problems:  Unstable angina - cycle enzymes, MD advise on increasing Ranexa. Will continue other Rx.   HYPERLIPIDEMIA - on low-dose statin, profile is recent, f/u in office   HYPERTENSION - should be controlled with diuresis   Sinoatrial node  dysfunction - pacing appropriately   Ischemic cardiomyopathy - med Rx   COPD - no wheeze, follow.    Signed: Theodore Demark 08/21/2011, 9:05 AM Co-Sign MD Patient seen and examined and history reviewed.  Agree with above findings and plan. Frail 78 yo BF with known critical 3 vessel and left main CAD and ischemic cardiomyopathy presents with 2-3 day history of worsening angina and SOB. Lungs reveal bilateral rales. She has 5-6 cm JVD. Ecg shows ST-T changes consistent with inferior and lateal ischemia. Troponin elevated consistent with NSTEMI. Her revascularization options are limited. Will review films to see if high risk PCI even an option. Clearly not a candidate for CABG. Discussed CODE status. Patient has a living will but clearly hasn't given much thought to whether she would want ventilator/CPR/DCCV/ etc. She is clear she doesn't want anything extraordinary such as surgery. I suggested she discuss this more with her family. Will Admit for IV heparin and Ntg to cool things down. Diuresis with IV Lasix.Check serial enzymes. May need higher dose of Ranexa.  Theron Arista [facility] 08/21/2011 11:29 AM

## 2011-08-21 NOTE — Progress Notes (Signed)
ANTICOAGULATION CONSULT NOTE - Initial Consult  Pharmacy Consult for heparin Indication: chest pain/ACS  Allergies  Allergen Reactions  . Simvastatin     unknown    Patient Measurements: Height: 5\' 2"  (157.5 cm) Weight: 103 lb 9.9 oz (47 kg) IBW/kg (Calculated) : 50.1  Heparin Dosing Weight: 47 kg   Vital Signs: Temp: 98.4 F (36.9 C) (07/31 1500) Temp src: Oral (07/31 1500) BP: 115/51 mmHg (07/31 1900) Pulse Rate: 59  (07/31 1900)  Labs:  Basename 08/21/11 1741 08/21/11 0901 08/21/11 0826 08/21/11 0820  HGB 11.1* 11.6* -- --  HCT 31.3* 34.0* 31.1* --  PLT 236 -- 224 --  APTT -- -- -- --  LABPROT -- -- -- --  INR -- -- -- --  HEPARINUNFRC -- -- -- --  CREATININE 1.20* 1.30* -- --  CKTOTAL 818* -- -- 110  CKMB 34.0* -- -- 5.6*  TROPONINI >20.00* -- -- 1.09*    Estimated Creatinine Clearance: 24 ml/min (by C-G formula based on Cr of 1.2).   Medical History: Past Medical History  Diagnosis Date  . Hyperlipidemia   . Hypertension   . Hypothyroidism 6/11    Thyroid ablation, s/p treatment with iodine  . Hypercalcemia   . TIA (transient ischemic attack) 2000    ?TIA; carotid u/s 1-15% and brain MRI lacunar dz  . Chronic neck pain 2002  . CAD (coronary artery disease)     a. Cath 04/2011: LM 90, LAD Critical Ca stenosis ost, LCX 95 ost, RCA 70p, R->L collats,   med managed. b) NSTEM 6/13  . Ischemic cardiomyopathy     a. EF 35% 04/2011 LV Gram.  . Tachy-brady syndrome 2003    Per holter, s/p pacemaker implantation in [place] (pacemaker replaced 11/11 with Medtronic Device)  . Pneumonia March 2013  . DJD (degenerative joint disease) of cervical spine 2002    MRI Cspine  . ACE inhibitor intolerance   . Myocardial infarction   . Anginal pain   . Pacemaker   . Shortness of breath   . CHF (congestive heart failure)     Medications:  Prescriptions prior to admission  Medication Sig Dispense Refill  . amLODipine (NORVASC) 2.5 MG tablet Take 1 tablet (2.5 mg total)  by mouth daily.  30 tablet  11  . aspirin EC 81 MG tablet Take 1 tablet (81 mg total) by mouth daily.  30 tablet    . cholecalciferol (VITAMIN D) 1000 UNITS tablet Take 1,000 Units by mouth daily.        . clopidogrel (PLAVIX) 75 MG tablet Take 1 tablet (75 mg total) by mouth daily with breakfast.  30 tablet  11  . furosemide (LASIX) 40 MG tablet Take 0.5 tablets (20 mg total) by mouth daily.      Marland Kitchen levothyroxine (SYNTHROID, LEVOTHROID) 100 MCG tablet Take 100 mcg by mouth daily.      Marland Kitchen losartan (COZAAR) 25 MG tablet Take 1 tablet (25 mg total) by mouth daily.  30 tablet  3  . metoprolol tartrate (LOPRESSOR) 25 MG tablet Take 1.5 tablets (37.5 mg total) by mouth 2 (two) times daily.  90 tablet  11  . nitroGLYCERIN (NITROSTAT) 0.4 MG SL tablet Place 0.4 mg under the tongue every 5 (five) minutes as needed. For chest pain      . potassium chloride SA (K-DUR,KLOR-CON) 20 MEQ tablet Take 1 tablet (20 mEq total) by mouth daily.      . ranolazine (RANEXA) 500 MG 12 hr tablet Take  1 tablet (500 mg total) by mouth 2 (two) times daily.  60 tablet  11  . rosuvastatin (CRESTOR) 5 MG tablet Take 1 tablet (5 mg total) by mouth 2 (two) times a week. Take on Tuesdays and Saturdays.  30 tablet  3    Assessment: 78yo F with ACS and elevated cardiac enzymes consistent with NSTEMI in the setting of known critical 3 vessel and L main CAD with limited revascularization options.  No history of bleeding disorder, hemorrhagic stroke, blood thinners at home or recent surgery, trauma noted.  Goal of Therapy:  Heparin level 0.3-0.7 units/ml Monitor platelets by anticoagulation protocol: Yes   Plan:  -Check baseline INR now, stat -Heparin 3000 units IV x1 then initiate heparin at 650 units/hr -Check 8h heparin level and CBC -Check daily heparin level and CBC   Lori Trujillo L. Illene Bolus, PharmD, BCPS Clinical Pharmacist Pager: [phone] Pharmacy: [phone] 08/21/2011 7:39 PM

## 2011-08-21 NOTE — ED Notes (Signed)
Titrated nitro drip to 2mcg/min. Waiting on admission orders.

## 2011-08-22 DIAGNOSIS — J449 Chronic obstructive pulmonary disease, unspecified: Secondary | ICD-10-CM

## 2011-08-22 LAB — BASIC METABOLIC PANEL
CO2: 28 mEq/L (ref 19–32)
Calcium: 9.2 mg/dL (ref 8.4–10.5)
Chloride: 101 mEq/L (ref 96–112)
Sodium: 139 mEq/L (ref 135–145)

## 2011-08-22 LAB — CBC
MCH: 29.5 pg (ref 26.0–34.0)
Platelets: 200 10*3/uL (ref 150–400)
RBC: 3.25 MIL/uL — ABNORMAL LOW (ref 3.87–5.11)
WBC: 9.3 10*3/uL (ref 4.0–10.5)

## 2011-08-22 LAB — HEPARIN LEVEL (UNFRACTIONATED): Heparin Unfractionated: 0.45 IU/mL (ref 0.30–0.70)

## 2011-08-22 MED ORDER — MORPHINE SULFATE 10 MG/5ML PO SOLN
2.6000 mg | ORAL | Status: DC | PRN
  Administered 2011-08-22: 2.6 mg via SUBLINGUAL
  Filled 2011-08-22 (×2): qty 5

## 2011-08-22 MED ORDER — MORPHINE SULFATE (CONCENTRATE) 20 MG/ML PO SOLN: 2.5000 mg | ORAL | Status: DC | PRN

## 2011-08-22 MED ORDER — WHITE PETROLATUM GEL
Status: AC
  Filled 2011-08-22: qty 5

## 2011-08-22 MED ORDER — ROSUVASTATIN CALCIUM 10 MG PO TABS
10.0000 mg | ORAL_TABLET | ORAL | Status: DC
  Administered 2011-08-22 – 2011-08-26 (×2): 10 mg via ORAL
  Filled 2011-08-22 (×2): qty 1

## 2011-08-22 NOTE — Progress Notes (Signed)
Palliative care Brief consult, full consult to follow...   Assessment of patients Current state: 78 yr old female with CAD, critical 3 vessel disease, admitted with NSEMI,not a candidate for CABG or PCI per cardiology. Patient has ongoing intermittent chest pain, dyspnea. Patient wants comfort care only, does not want to be readmitted to hospital. Wants hospice at home.  Goals of Care: 1.  Code Status: DNR/DNI   2. Scope of Treatment: 1. Vital Signs: Q shift 2. Respiratory/Oxygen: Oxygen via nasal canula 3. Nutritional Support/Tube Feeds: No long term tube feeding 4. IVF: Continue 5. Review of Medications to be discontinued: Continue all the medications at this time 6. Labs: Continue 7. Telemetry: Yes   4. Disposition: Home with hospice   3. Symptom Management:   1. Anxiety/Agitation: None 2. Pain: Roxanol 2.5 mg po /sl q 4 hr prn 3. Bowel Regimen: Milk of magnesia prn, Last BM this morning 4. Dyspnea: Roxanol prn.  4. Psychosocial: Patient to go home with son  5. Spiritual: No spiritual needs at this time

## 2011-08-22 NOTE — Progress Notes (Signed)
ANTICOAGULATION CONSULT NOTE  Pharmacy Consult for heparin Indication: chest pain/ACS  Allergies  Allergen Reactions  . Simvastatin     unknown    Patient Measurements: Height: 5\' 2"  (157.5 cm) Weight: 104 lb 11.5 oz (47.5 kg) IBW/kg (Calculated) : 50.1  Heparin Dosing Weight: 47 kg   Vital Signs: Temp: 98.2 F (36.8 C) (08/01 0337) Temp src: Oral (08/01 0337) BP: 104/52 mmHg (08/01 0410) Pulse Rate: 59  (08/01 0410)  Labs:  Basename 08/22/11 0325 08/21/11 2214 08/21/11 1741 08/21/11 0901 08/21/11 0826  HGB 9.6* -- 11.1* -- --  HCT 28.0* -- 31.3* 34.0* --  PLT 200 -- 236 -- 224  APTT -- -- -- -- --  LABPROT -- 18.0* -- -- --  INR -- 1.46 -- -- --  HEPARINUNFRC 0.55 -- -- -- --  CREATININE 1.23* -- 1.20* 1.30* --  CKTOTAL 615* 720* 818* -- --  CKMB 15.7* 23.1* 34.0* -- --  TROPONINI >20.00* >20.00* >20.00* -- --    Estimated Creatinine Clearance: 23.7 ml/min (by C-G formula based on Cr of 1.23).  Assessment: 78 yo female with NSTEMI for Heparin Goal of Therapy:  Heparin level 0.3-0.7 units/ml Monitor platelets by anticoagulation protocol: Yes   Plan:  Continue Heparin at current rate Recheck level in 8 hrs to verify  Geannie Risen, PharmD, BCPS  08/22/2011 5:03 AM

## 2011-08-22 NOTE — Progress Notes (Signed)
Pt BP still low after nitro gtt turned off.  Checked automatic BP on both arms, still below 100.  Manual BP checked (prior to calling MD per order) when pt more awake: 104/52  Will continue to monitor  Maximino Greenland RN

## 2011-08-22 NOTE — Progress Notes (Signed)
Patient received a dose of Morphine by day shift nurse at 1852.  She began feeling a little "woozy" and "light-headed" when I assessed her at 49.  BP was 105/65; was completely Alert and oriented.  Patient fell asleep and awoke around 2115 disoriented to surroundings.  She remembered why she came to the hospital, but seemed to think her family was supposed to be taking her home since it was the end of the day.  She was oriented to self and time, but not place.  BP 109/51; equal strength in arms and legs bilaterally; no pain; only complaint is that she is feeling a little short of breath.  Increased O2 from 2L to 3L.  She reports having a slight headache before falling asleep, but it was gone when she awoke.  Will continue to monitor patient for changes.    Vivi Martens RN

## 2011-08-22 NOTE — Progress Notes (Signed)
Utilization review completed.  

## 2011-08-22 NOTE — H&P (Addendum)
CARDIOLOGY HISTORY AND PHYSICAL   Patient ID: Lori Trujillo MRN: [id number] DOB/AGE: [birth year] 78 y.o.  Admit date: 08/21/2011  Primary Physician   Lori Ora, MD Primary Cardiologist   SK Reason for Consultation   SOB, chest pain  WUJ:[id number] Judie Petit Lori Trujillo is a 78 y.o. female with a history of CAD.   Over the last 3 days, she has noted increasing shortness of breath and has had several episodes of chest pain. She has had to take nitroglycerin both Monday, Tuesday and today. Her chest pain is otherwise relieved by rest. She has been short of breath with exertion and also notes that she does not tolerate heat. She has been eating poorly but denies weight gain. She has orthopnea but denies PND. Early this a.m., she had 7/10 chest pain and shortness of breath. She came to the ER where her CXR shows pulmonary edema. Her chest pain has resolved with NTG she took at home. She fell because she was too weak and SOB to get back to the bedroom from the bathroom. She was able to call 911 and was transported by EMS to the ER. She feels better now but is still notably SOB.   Past Medical History  Diagnosis Date  . Hyperlipidemia   . Hypertension   . Hypothyroidism 6/11    Thyroid ablation, s/p treatment with iodine  . Hypercalcemia   . TIA (transient ischemic attack) 2000    ?TIA; carotid u/s 1-15% and brain MRI lacunar dz  . Chronic neck pain 2002  . CAD (coronary artery disease)     a. Cath 04/2011: LM 90, LAD Critical Ca stenosis ost, LCX 95 ost, RCA 70p, R->L collats,   med managed. b) NSTEM 6/13  . Ischemic cardiomyopathy     a. EF 35% 04/2011 LV Gram.  . Tachy-brady syndrome 2003    Per holter, s/p pacemaker implantation in [place] (pacemaker replaced 11/11 with [employer] Device)  . Pneumonia March 2013  . DJD (degenerative joint disease) of cervical spine 2002    MRI Cspine  . ACE inhibitor intolerance      Past Surgical History  Procedure Date  . Abdominal hysterectomy   . Tonsillectomy   .  Cardiac catheterization 04/2011  . Insert / replace / remove pacemaker 2005    Allergies  Allergen Reactions  . Simvastatin     unknown    I have reviewed the patient's current medications   . nitroGLYCERIN  5 mcg/min Intravenous Once   Medication Sig  amLODipine (NORVASC) 2.5 MG tablet Take 1 tablet (2.5 mg total) by mouth daily.  aspirin EC 81 MG tablet Take 1 tablet (81 mg total) by mouth daily.  cholecalciferol (VITAMIN D) 1000 UNITS tablet Take 1,000 Units by mouth daily.    clopidogrel (PLAVIX) 75 MG tablet Take 1 tablet (75 mg total) by mouth daily with breakfast.  furosemide (LASIX) 40 MG tablet Take 0.5 tablets (20 mg total) by mouth daily.  levothyroxine (SYNTHROID, LEVOTHROID) 100 MCG tablet Take 100 mcg by mouth daily.  losartan (COZAAR) 25 MG tablet Take 1 tablet (25 mg total) by mouth daily.  metoprolol tartrate (LOPRESSOR) 25 MG tablet Take 1.5 tablets (37.5 mg total) by mouth 2 (two) times daily.  nitroGLYCERIN (NITROSTAT) 0.4 MG SL tablet Place 0.4 mg under the tongue every 5 (five) minutes as needed. For chest pain  potassium chloride SA (K-DUR,KLOR-CON) 20 MEQ tablet Take 1 tablet (20 mEq total) by mouth daily.  ranolazine (RANEXA) 500 MG 12 hr  tablet Take 1 tablet (500 mg total) by mouth 2 (two) times daily.  rosuvastatin (CRESTOR) 5 MG tablet Take 1 tablet (5 mg total) by mouth 2 (two) times a week. Take on Tuesdays and Saturdays.   History   Social History  . Marital Status: Widowed    Spouse Name: N/A    Number of Children: N/A  . Years of Education: N/A   Occupational History  . 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 Smoking status: Former Smoker -- 0.2 packs/day    Quit date: 01/21/1973  . Smokeless tobacco: Not on file  . Alcohol Use: No  . Drug Use: No  . Sexually Active: No     stopped 1970's   Other Topics Concern  . Not on file   Social History Narrative   Lives by self, independent on her ADL, sometimes has  difficult time getting in and out of shower.Not driving anymore..Son and daughter in [place].Marland KitchenMarland KitchenWas doing tai-chi till 4/13..Only child   Family History  Problem Relation Age of Onset  . Cancer Neg Hx     No breast or colon  . Goiter Neg Hx     No other thyroid problems  . Heart attack Son     Family Status  Relation Status Death Age  . Son Deceased     Died 06-18-2022 from MI  . Mother Deceased   . Father Deceased    ROS: Pt has very little ability to exert herself without SOB or chest pain. Initially, she improved on the Ranexa. She has not gained weight. She is eating poorly. She and her family feel she would benefit from a motorized wheelchair. She has not had fevers or chills. Full 14 point review of systems complete and found to be negative unless listed above.  Physical Exam: Blood pressure 119/51, pulse 63, temperature 98.3 F (36.8 C), temperature source Oral, resp. rate 19, height 5\' 2"  (1.575 m), weight 90 lb (40.824 kg), SpO2 96.00%.  General: Well developed, frail, elderly, female with moderate distress Head: Eyes PERRLA, No xanthomas.   Normocephalic and atraumatic, oropharynx without edema or exudate. Dentition - OK Lungs: bilateral basilar crackles Heart: HRRR S1 S2, no rub/gallop, no significant murmur. pulses are 2+ all 4 extrem.   Neck: No carotid bruits. No lymphadenopathy.  JVD at 12 cm. Abdomen: Bowel sounds present, abdomen soft and non-tender without masses or hernias noted. Msk:  No spine or cva tenderness. No weakness, no joint deformities or effusions. Extremities: No clubbing or cyanosis. no edema.  Neuro: Alert and oriented X 3. No focal deficits noted. Psych:  Good affect, responds appropriately Skin: No rashes or lesions noted.  Labs:   Lab Results  Component Value Date   WBC 11.5* 08/21/2011   HGB 11.6* 08/21/2011   HCT 34.0* 08/21/2011   MCV 88.4 08/21/2011   PLT 224 08/21/2011     Lab 08/21/11 0901  NA 141  K 4.8  CL 107  CO2 --  BUN 27*    CREATININE 1.30*  CALCIUM --  PROT --  BILITOT --  ALKPHOS --  ALT --  AST --  GLUCOSE 187*   Lab Results  Component Value Date   CHOL 221* 08/09/2011   HDL 92.00 08/09/2011   LDLCALC 100* 05/04/2011   TRIG 83.0 08/09/2011   Lab Results  Component Value Date   CKTOTAL 110 08/21/2011   CKMB 5.6* 08/21/2011   TROPONINI 1.09* 08/21/2011   Echo: 06/14/2009 Study  Conclusions - Left ventricle: The cavity size was normal. Wall thickness was increased in a pattern of mild LVH. There was mild focal basal hypertrophy of the septum. Systolic function was normal. The estimated ejection fraction was in the range of 60% to 65%. Wall motion was normal; there were no regional wall motion abnormalities. Doppler parameters are consistent with abnormal left ventricular relaxation (grade 1 diastolic dysfunction). - Aortic valve: Trivial regurgitation. - Mitral valve: Mildly calcified annulus. - Pulmonary arteries: Systolic pressure was mildly increased.  Cardiac Cath: 05/06/11 Dr Excell Seltzer Left mainstem: the left main is severely calcified. There is severe distal left main disease of 90%.  Left anterior descending (LAD): the LAD has critical, heavily calcified ostial stenosis. There is slow flow into the first diagonal branch and competitive flow into the mid LAD. The mid and distal LAD fill competitively from right to left collaterals.  Left circumflex (LCx): the left circumflex also has subtotal occlusion with severe 95% calcification involving the distal left mainstem. The vessel gives off 2 tiny OM branches and a moderate caliber third OM that has 60% proximal stenosis.  Right coronary artery (RCA): the RCA is severely calcified. The proximal vessel has 70% stenosis. The mid vessel is patent. The distal vessel has 50% stenosis. The PDA and posterolateral branches are diffusely diseased but patent.  Left ventriculography:there is severe hypokinesis of the distal anterior wall and akinesis of the apex.  The basal inferior and basal anterior walls contract normally. The left ventricular ejection fraction is estimated at 35%.   ECG:21-Aug-2011 07:46:46 SINUS RHYTHM ~ normal P axis, V-rate 50- 99 PROBABLE LEFT ATRIAL ABNORMALITY ~ P >36mS, <-0.17mV V1 LVH WITH SECONDARY REPOLARIZATION ABNORMALITY ~ R56L/RISIII/S12R56/S3RL & rep abn ST DEPRESSION, CONSIDER ISCHEMIA, INF LEADS ~ ST <-0.81mV, II III aVF ANTERIOR ST ELEVATION, PROBABLY DUE TO LVH ~ ST >0.20 mV in V1-V4 & LVH Standard 12 Lead Report ~ Unconfirmed Interpretation Abnormal ECG 52mm/s 52mm/mV 150Hz  8.0.1 12SL 235 CID: [id number] Referred by: Unconfirmed Vent. rate 65 BPM PR interval 180 ms QRS duration 108 ms QT/QTc 452/470 ms P-R-T axes 43 -3 192    Radiology:  Dg Chest Port 1 View 08/21/2011  *RADIOLOGY REPORT*  Clinical Data: Chest pain and shortness of breath.  PORTABLE CHEST - 1 VIEW  Comparison: 06/28/2011  Findings: Semi upright portable view of the chest was obtained. Patchy airspace densities, right side greater than left.  Heart size is stable.  Atherosclerotic calcifications involving the arch. The patient has a left cardiac pacemaker.  Mild fullness in the right hilum could be related to vascular congestion.  IMPRESSION: Patchy interstitial and airspace densities suggest asymmetric pulmonary edema.  Original Report Authenticated By: Richarda Overlie, M.D.    ASSESSMENT AND PLAN:   The patient was seen today by Dr [place], the patient evaluated and the data reviewed.  Principal Problem:  *Acute on chronic systolic CHF (congestive heart failure) - Pt has not gained weight but has volume overload by symptoms and exam. Admit, diurese, follow renal function closely. Supp K+ PRN.   Active Problems:  Unstable angina - cycle enzymes, MD advise on increasing Ranexa. Will continue other Rx.   HYPERLIPIDEMIA - on low-dose statin, profile is recent, f/u in office   HYPERTENSION - should be controlled with diuresis   Sinoatrial node  dysfunction - pacing appropriately   Ischemic cardiomyopathy - med Rx   COPD - no wheeze, follow.    Signed: Theodore Demark 08/21/2011, 9:05 AM Co-Sign MD Patient seen and examined and  history reviewed. Agree with above findings and plan. Frail 78 yo BF with known critical 3 vessel and left main CAD and ischemic cardiomyopathy presents with 2-3 day history of worsening angina and SOB. Lungs reveal bilateral rales. She has 5-6 cm JVD.Chest Xray shows pulmonary edema consistent with CHF. Ecg shows ST-T changes consistent with inferior and lateal ischemia. Troponin elevated consistent with NSTEMI. Her revascularization options are limited. Will review films to see if high risk PCI even an option. Clearly not a candidate for CABG. Discussed CODE status. Patient has a living will but clearly hasn't given much thought to whether she would want ventilator/CPR/DCCV/ etc. She is clear she doesn't want anything extraordinary such as surgery. I suggested she discuss this more with her family. Will Admit for IV heparin and Ntg to cool things down. Diuresis with IV Lasix.Check serial enzymes. May need higher dose of Ranexa.  Theron Arista [facility] 08/21/2011 11:29 AM

## 2011-08-22 NOTE — H&P (Deleted)
Peter M Swaziland, MD Physician Signed Cardiology Consult Note 08/21/2011 9:05 AM      CARDIOLOGY CONSULT NOTE      Patient ID: BREONIA KIRSTEIN MRN: [id number] DOB/AGE: 76/23/1923 76 y.o.   Admit date: 08/21/2011   Primary Physician   Willow Ora, MD Primary Cardiologist   SK Reason for Consultation   SOB, chest pain   YNW:[id number] Judie Petit Graw is a 78 y.o. female with a history of CAD.   Over the last 3 days, she has noted increasing shortness of breath and has had several episodes of chest pain. She has had to take nitroglycerin both Monday, Tuesday and today. Her chest pain is otherwise relieved by rest. She has been short of breath with exertion and also notes that she does not tolerate heat. She has been eating poorly but denies weight gain. She has orthopnea but denies PND. Early this a.m., she had 7/10 chest pain and shortness of breath. She came to the ER where her CXR shows pulmonary edema. Her chest pain has resolved with NTG she took at home. She fell because she was too weak and SOB to get back to the bedroom from the bathroom. She was able to call 911 and was transported by EMS to the ER. She feels better now but is still notably SOB.      Past Medical History   Diagnosis  Date   .  Hyperlipidemia     .  Hypertension     .  Hypothyroidism  6/11       Thyroid ablation, s/p treatment with iodine   .  Hypercalcemia     .  TIA (transient ischemic attack)  2000       ?TIA; carotid u/s 1-15% and brain MRI lacunar dz   .  Chronic neck pain  2002   .  CAD (coronary artery disease)         a. Cath 04/2011: LM 90, LAD Critical Ca stenosis ost, LCX 95 ost, RCA 70p, R->L collats,   med managed. b) NSTEM 6/13   .  Ischemic cardiomyopathy         a. EF 35% 04/2011 LV Gram.   .  Tachy-brady syndrome  2003       Per holter, s/p pacemaker implantation in [place] (pacemaker replaced 11/11 with [employer] Device)   .  Pneumonia  March 2013   .  DJD (degenerative joint disease) of cervical spine  2002         MRI Cspine   .  ACE inhibitor intolerance         Past Surgical History   Procedure  Date   .  Abdominal hysterectomy     .  Tonsillectomy     .  Cardiac catheterization  04/2011   .  Insert / replace / remove pacemaker  2005       Allergies   Allergen  Reactions   .  Simvastatin         unknown      I have reviewed the patient's current medications     .  nitroGLYCERIN   5 mcg/min  Intravenous  Once    Medication  Sig   amLODipine (NORVASC) 2.5 MG tablet  Take 1 tablet (2.5 mg total) by mouth daily.   aspirin EC 81 MG tablet  Take 1 tablet (81 mg total) by mouth daily.   cholecalciferol (VITAMIN D) 1000 UNITS tablet  Take 1,000 Units by mouth daily.  clopidogrel (PLAVIX) 75 MG tablet  Take 1 tablet (75 mg total) by mouth daily with breakfast.   furosemide (LASIX) 40 MG tablet  Take 0.5 tablets (20 mg total) by mouth daily.   levothyroxine (SYNTHROID, LEVOTHROID) 100 MCG tablet  Take 100 mcg by mouth daily.   losartan (COZAAR) 25 MG tablet  Take 1 tablet (25 mg total) by mouth daily.   metoprolol tartrate (LOPRESSOR) 25 MG tablet  Take 1.5 tablets (37.5 mg total) by mouth 2 (two) times daily.   nitroGLYCERIN (NITROSTAT) 0.4 MG SL tablet  Place 0.4 mg under the tongue every 5 (five) minutes as needed. For chest pain   potassium chloride SA (K-DUR,KLOR-CON) 20 MEQ tablet  Take 1 tablet (20 mEq total) by mouth daily.   ranolazine (RANEXA) 500 MG 12 hr tablet  Take 1 tablet (500 mg total) by mouth 2 (two) times daily.   rosuvastatin (CRESTOR) 5 MG tablet  Take 1 tablet (5 mg total) by mouth 2 (two) times a week. Take on Tuesdays and Saturdays.       History       Social History   .  Marital Status:  Widowed       Spouse Name:  N/A       Number of Children:  N/A   .  Years of Education:  N/A       Occupational History   .  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  Smoking status:  Former Smoker -- 0.2 packs/day       Quit date:   01/21/1973   .  Smokeless tobacco:  Not on file   .  Alcohol Use:  No   .  Drug Use:  No   .  Sexually Active:  No         stopped 1970's       Other Topics  Concern   .  Not on file       Social History Narrative     Lives by self, independent on her ADL, sometimes has difficult time getting in and out of shower.Not driving anymore..Son and daughter in [place].Marland KitchenMarland KitchenWas doing tai-chi till 4/13..Only child    Family History   Problem  Relation  Age of Onset   .  Cancer  Neg Hx         No breast or colon   .  Goiter  Neg Hx         No other thyroid problems   .  Heart attack  Son        Family Status   Relation  Status  Death Age   .  Son  Deceased         Died 06/25/22 from MI   .  Mother  Deceased     .  Father  Deceased      ROS: Pt has very little ability to exert herself without SOB or chest pain. Initially, she improved on the Ranexa. She has not gained weight. She is eating poorly. She and her family feel she would benefit from a motorized wheelchair. She has not had fevers or chills. Full 14 point review of systems complete and found to be negative unless listed above.   Physical Exam: Blood pressure 119/51, pulse 63, temperature 98.3 F (36.8 C), temperature source Oral, resp. rate 19, height 5\' 2"  (1.575 m), weight 90 lb (40.824 kg), SpO2 96.00%.  General:  Well developed, frail, elderly, female with moderate distress Head: Eyes PERRLA, No xanthomas.   Normocephalic and atraumatic, oropharynx without edema or exudate. Dentition - OK Lungs: bilateral basilar crackles Heart: HRRR S1 S2, no rub/gallop, no significant murmur. pulses are 2+ all 4 extrem.    Neck: No carotid bruits. No lymphadenopathy.  JVD at 12 cm. Abdomen: Bowel sounds present, abdomen soft and non-tender without masses or hernias noted. Msk:  No spine or cva tenderness. No weakness, no joint deformities or effusions. Extremities: No clubbing or cyanosis. no edema.   Neuro: Alert and oriented X 3. No  focal deficits noted. Psych:  Good affect, responds appropriately Skin: No rashes or lesions noted.   Labs:    Lab Results   Component  Value  Date     WBC  11.5*  08/21/2011     HGB  11.6*  08/21/2011     HCT  34.0*  08/21/2011     MCV  88.4  08/21/2011     PLT  224  08/21/2011      Lab  08/21/11 0901   NA  141   K  4.8   CL  107   CO2  --   BUN  27*   CREATININE  1.30*   CALCIUM  --   PROT  --   BILITOT  --   ALKPHOS  --   ALT  --   AST  --   GLUCOSE  187*    Lab Results   Component  Value  Date     CHOL  221*  08/09/2011     HDL  92.00  08/09/2011     LDLCALC  100*  05/04/2011     TRIG  83.0  08/09/2011    Lab Results   Component  Value  Date     CKTOTAL  110  08/21/2011     CKMB  5.6*  08/21/2011     TROPONINI  1.09*  08/21/2011    Echo: 06/14/2009 Study Conclusions - Left ventricle: The cavity size was normal. Wall thickness was increased in a pattern of mild LVH. There was mild focal basal hypertrophy of the septum. Systolic function was normal. The estimated ejection fraction was in the range of 60% to 65%. Wall motion was normal; there were no regional wall motion abnormalities. Doppler parameters are consistent with abnormal left ventricular relaxation (grade 1 diastolic dysfunction). - Aortic valve: Trivial regurgitation. - Mitral valve: Mildly calcified annulus. - Pulmonary arteries: Systolic pressure was mildly increased.  Cardiac Cath: 05/06/11 Dr Excell Seltzer Left mainstem: the left main is severely calcified. There is severe distal left main disease of 90%.   Left anterior descending (LAD): the LAD has critical, heavily calcified ostial stenosis. There is slow flow into the first diagonal branch and competitive flow into the mid LAD. The mid and distal LAD fill competitively from right to left collaterals.   Left circumflex (LCx): the left circumflex also has subtotal occlusion with severe 95% calcification involving the distal left mainstem. The vessel gives  off 2 tiny OM branches and a moderate caliber third OM that has 60% proximal stenosis.   Right coronary artery (RCA): the RCA is severely calcified. The proximal vessel has 70% stenosis. The mid vessel is patent. The distal vessel has 50% stenosis. The PDA and posterolateral branches are diffusely diseased but patent.   Left ventriculography:there is severe hypokinesis of the distal anterior wall and akinesis of the apex. The basal inferior and basal anterior walls  contract normally. The left ventricular ejection fraction is estimated at 35%.     ECG:21-Aug-2011 07:46:46 SINUS RHYTHM ~ normal P axis, V-rate 50- 99 PROBABLE LEFT ATRIAL ABNORMALITY ~ P >54mS, <-0.69mV V1 LVH WITH SECONDARY REPOLARIZATION ABNORMALITY ~ R56L/RISIII/S12R56/S3RL & rep abn ST DEPRESSION, CONSIDER ISCHEMIA, INF LEADS ~ ST <-0.90mV, II III aVF ANTERIOR ST ELEVATION, PROBABLY DUE TO LVH ~ ST >0.20 mV in V1-V4 & LVH Standard 12 Lead Report ~ Unconfirmed Interpretation Abnormal ECG 93mm/s 85mm/mV 150Hz  8.0.1 12SL 235 CID: [id number] Referred by: Unconfirmed Vent. rate 65 BPM PR interval 180 ms QRS duration 108 ms QT/QTc 452/470 ms P-R-T axes 43 -3 192     Radiology:  Dg Chest Port 1 View 08/21/2011  *RADIOLOGY REPORT*  Clinical Data: Chest pain and shortness of breath.  PORTABLE CHEST - 1 VIEW  Comparison: 06/28/2011  Findings: Semi upright portable view of the chest was obtained. Patchy airspace densities, right side greater than left.  Heart size is stable.  Atherosclerotic calcifications involving the arch. The patient has a left cardiac pacemaker.  Mild fullness in the right hilum could be related to vascular congestion.  IMPRESSION: Patchy interstitial and airspace densities suggest asymmetric pulmonary edema.  Original Report Authenticated By: Richarda Overlie, M.D.      ASSESSMENT AND PLAN:   The patient was seen today by Dr Swaziland, the patient evaluated and the data reviewed.   Principal Problem:  *Acute on chronic  systolic CHF (congestive heart failure) - Pt has not gained weight but has volume overload by symptoms and exam. Admit, diurese, follow renal function closely. Supp K+ PRN.    Active Problems:  Unstable angina - cycle enzymes, MD advise on increasing Ranexa. Will continue other Rx.    HYPERLIPIDEMIA - on low-dose statin, profile is recent, f/u in office    HYPERTENSION - should be controlled with diuresis    Sinoatrial node dysfunction - pacing appropriately    Ischemic cardiomyopathy - med Rx    COPD - no wheeze, follow.       Signed: Theodore Demark 08/21/2011, 9:05 AM Co-Sign MD Patient seen and examined and history reviewed. Agree with above findings and plan. Frail 78 yo BF with known critical 3 vessel and left main CAD and ischemic cardiomyopathy presents with 2-3 day history of worsening angina and SOB. Lungs reveal bilateral rales. She has 5-6 cm JVD. Ecg shows ST-T changes consistent with inferior and lateal ischemia. Troponin elevated consistent with NSTEMI. Her revascularization options are limited. Will review films to see if high risk PCI even an option. Clearly not a candidate for CABG. Discussed CODE status. Patient has a living will but clearly hasn't given much thought to whether she would want ventilator/CPR/DCCV/ etc. She is clear she doesn't want anything extraordinary such as surgery. I suggested she discuss this more with her family. Will Admit for IV heparin and Ntg to cool things down. Diuresis with IV Lasix.Check serial enzymes. May need higher dose of Ranexa.   Theron Arista [facility] 08/21/2011 11:29 AM

## 2011-08-22 NOTE — Progress Notes (Signed)
ANTICOAGULATION CONSULT NOTE  Pharmacy Consult for heparin Indication: chest pain/ACS  Allergies  Allergen Reactions  . Simvastatin     unknown    Patient Measurements: Height: 5\' 2"  (157.5 cm) Weight: 104 lb 11.5 oz (47.5 kg) IBW/kg (Calculated) : 50.1  Heparin Dosing Weight: 47 kg   Vital Signs: Temp: 98.2 F (36.8 C) (08/01 1100) Temp src: Oral (08/01 1100) BP: 116/51 mmHg (08/01 1100) Pulse Rate: 60  (08/01 1100)  Labs:  Basename 08/22/11 1129 08/22/11 0325 08/21/11 2214 08/21/11 1741 08/21/11 0901 08/21/11 0826  HGB -- 9.6* -- 11.1* -- --  HCT -- 28.0* -- 31.3* 34.0* --  PLT -- 200 -- 236 -- 224  APTT -- -- -- -- -- --  LABPROT -- -- 18.0* -- -- --  INR -- -- 1.46 -- -- --  HEPARINUNFRC 0.45 0.55 -- -- -- --  CREATININE -- 1.23* -- 1.20* 1.30* --  CKTOTAL -- 615* 720* 818* -- --  CKMB -- 15.7* 23.1* 34.0* -- --  TROPONINI -- >20.00* >20.00* >20.00* -- --    Estimated Creatinine Clearance: 23.7 ml/min (by C-G formula based on Cr of 1.23).  Assessment: 78 yo female with NSTEMI for Heparin.  Heparin level = 0.45 on 650 units/hr No bleeding reported.   Goal of Therapy:  Heparin level 0.3-0.7 units/ml Monitor platelets by anticoagulation protocol: Yes   Plan:  Continue Heparin at current rate 650 units/hr Daily Heparin level and CBC   Noah Delaine, RPh Clinical Pharmacist Pager: [phone] 08/22/2011 2:42 PM

## 2011-08-22 NOTE — Progress Notes (Signed)
Subjective: Patient denies CP though still a little tight.  Does have some SOB. Objective: Filed Vitals:   08/22/11 0630 08/22/11 0700 08/22/11 0835 08/22/11 0936  BP: 102/53   114/77  Pulse: 61   62  Temp:  98 F (36.7 C)    TempSrc:  Oral    Resp: 16     Height:      Weight:      SpO2: 99%  100%    Weight change:   Intake/Output Summary (Last 24 hours) at 08/22/11 1035 Last data filed at 08/22/11 0700  Gross per 24 hour  Intake 334.56 ml  Output   1525 ml  Net -1190.44 ml    General: Alert, awake, oriented x3, in no acute distress Neck:  JVP isincreased Heart: Regular rate and rhythm, without murmurs, rubs, gallops.  Lungs: Diffuse rales/wheezes. Exemities:  No edema.   Neuro: Grossly intact, nonfocal.  Tele:  SR Lab Results: Results for orders placed during the hospital encounter of 08/21/11 (from the past 24 hour(s))  VITAMIN B12     Status: Normal   Collection Time   08/21/11 12:08 PM      Component Value Range   Vitamin B-12 503  211 - 911 pg/mL  FOLATE     Status: Normal   Collection Time   08/21/11 12:08 PM      Component Value Range   Folate 14.4    IRON AND TIBC     Status: Abnormal   Collection Time   08/21/11 12:08 PM      Component Value Range   Iron 19 (*) 42 - 135 ug/dL   TIBC 161  096 - 045 ug/dL   Saturation Ratios 6 (*) 20 - 55 %   UIBC 284  125 - 400 ug/dL  FERRITIN     Status: Normal   Collection Time   08/21/11 12:08 PM      Component Value Range   Ferritin 142  10 - 291 ng/mL  RETICULOCYTES     Status: Abnormal   Collection Time   08/21/11 12:08 PM      Component Value Range   Retic Ct Pct 1.4  0.4 - 3.1 %   RBC. 3.66 (*) 3.87 - 5.11 MIL/uL   Retic Count, Manual 51.2  19.0 - 186.0 K/uL  MRSA PCR SCREENING     Status: Normal   Collection Time   08/21/11  3:07 PM      Component Value Range   MRSA by PCR NEGATIVE  NEGATIVE  CARDIAC PANEL(CRET KIN+CKTOT+MB+TROPI)     Status: Abnormal   Collection Time   08/21/11  5:41 PM   Component Value Range   Total CK 818 (*) 7 - 177 U/L   CK, MB 34.0 (*) 0.3 - 4.0 ng/mL   Troponin I >20.00 (*) <0.30 ng/mL   Relative Index 4.2 (*) 0.0 - 2.5  TSH     Status: Normal   Collection Time   08/21/11  5:41 PM      Component Value Range   TSH 2.997  0.350 - 4.500 uIU/mL  CBC     Status: Abnormal   Collection Time   08/21/11  5:41 PM      Component Value Range   WBC 9.3  4.0 - 10.5 K/uL   RBC 3.57 (*) 3.87 - 5.11 MIL/uL   Hemoglobin 11.1 (*) 12.0 - 15.0 g/dL   HCT 40.9 (*) 81.1 - 91.4 %   MCV 87.7  78.0 -  100.0 fL   MCH 31.1  26.0 - 34.0 pg   MCHC 35.5  30.0 - 36.0 g/dL   RDW 16.1 (*) 09.6 - 04.5 %   Platelets 236  150 - 400 K/uL  CREATININE, SERUM     Status: Abnormal   Collection Time   08/21/11  5:41 PM      Component Value Range   Creatinine, Ser 1.20 (*) 0.50 - 1.10 mg/dL   GFR calc non Af Amer 39 (*) >90 mL/min   GFR calc Af Amer 45 (*) >90 mL/min  CARDIAC PANEL(CRET KIN+CKTOT+MB+TROPI)     Status: Abnormal   Collection Time   08/21/11 10:14 PM      Component Value Range   Total CK 720 (*) 7 - 177 U/L   CK, MB 23.1 (*) 0.3 - 4.0 ng/mL   Troponin I >20.00 (*) <0.30 ng/mL   Relative Index 3.2 (*) 0.0 - 2.5  PROTIME-INR     Status: Abnormal   Collection Time   08/21/11 10:14 PM      Component Value Range   Prothrombin Time 18.0 (*) 11.6 - 15.2 seconds   INR 1.46  0.00 - 1.49  CARDIAC PANEL(CRET KIN+CKTOT+MB+TROPI)     Status: Abnormal   Collection Time   08/22/11  3:25 AM      Component Value Range   Total CK 615 (*) 7 - 177 U/L   CK, MB 15.7 (*) 0.3 - 4.0 ng/mL   Troponin I >20.00 (*) <0.30 ng/mL   Relative Index 2.6 (*) 0.0 - 2.5  BASIC METABOLIC PANEL     Status: Abnormal   Collection Time   08/22/11  3:25 AM      Component Value Range   Sodium 139  135 - 145 mEq/L   Potassium 3.9  3.5 - 5.1 mEq/L   Chloride 101  96 - 112 mEq/L   CO2 28  19 - 32 mEq/L   Glucose, Bld 114 (*) 70 - 99 mg/dL   BUN 24 (*) 6 - 23 mg/dL   Creatinine, Ser 4.09 (*) 0.50 -  1.10 mg/dL   Calcium 9.2  8.4 - 81.1 mg/dL   GFR calc non Af Amer 38 (*) >90 mL/min   GFR calc Af Amer 44 (*) >90 mL/min  HEPARIN LEVEL (UNFRACTIONATED)     Status: Normal   Collection Time   08/22/11  3:25 AM      Component Value Range   Heparin Unfractionated 0.55  0.30 - 0.70 IU/mL  CBC     Status: Abnormal   Collection Time   08/22/11  3:25 AM      Component Value Range   WBC 9.3  4.0 - 10.5 K/uL   RBC 3.25 (*) 3.87 - 5.11 MIL/uL   Hemoglobin 9.6 (*) 12.0 - 15.0 g/dL   HCT 91.4 (*) 78.2 - 95.6 %   MCV 86.2  78.0 - 100.0 fL   MCH 29.5  26.0 - 34.0 pg   MCHC 34.3  30.0 - 36.0 g/dL   RDW 21.3 (*) 08.6 - 57.8 %   Platelets 200  150 - 400 K/uL    Studies/Results: Dg Chest Port 1 View  08/21/2011  *RADIOLOGY REPORT*  Clinical Data: Chest pain and shortness of breath.  PORTABLE CHEST - 1 VIEW  Comparison: 06/28/2011  Findings: Semi upright portable view of the chest was obtained. Patchy airspace densities, right side greater than left.  Heart size is stable.  Atherosclerotic calcifications involving the arch. The  patient has a left cardiac pacemaker.  Mild fullness in the right hilum could be related to vascular congestion.  IMPRESSION: Patchy interstitial and airspace densities suggest asymmetric pulmonary edema.  Original Report Authenticated By: Richarda Overlie, M.D.    Medications: Reviewed.}   Patient Active Hospital Problem List: 1.  NSTEMI Patient is ruling in for MI.   I have reviewed cath with Dayle Points from earlier this year.  Patient with severe dz of the L main.  LAD was subtotally occluded.  L Cx with dz.  He did not feel that she was a candidate for intervention.   I have discussed this with patient and her son.  I would recomm continued medical care.  I would also recomm contacting hospice. WIll continue diuressis   Close f/u of renal function Continue heparin for right now. Continue ranexa  2.  CHF  As noted above.  3.  HTN  SBP in 100s to 110s  4.  Tachybrady syndrom:   Patient s/p PPM  5.  HL  Continue statin  Increase to 10 Dietrich Pates 08/22/2011, 10:36 AM

## 2011-08-22 NOTE — Progress Notes (Signed)
Palliative Medicine Team consult requested by Dr Tenny Craw; spoke with patient; son Minerva Areola and daughter Bonita Quin at bedside they voiced all family are present and available to meet this evening if possible; son asked if there was a copy of the advanced directives that they have filled out on the chart - this was found in [employer] and printed out by Staff RN with additional copy left for family- PMT meeting time confirmed for today Thursday 8/1 @ 5:00 pm   Valente David, RN 08/22/2011, 5:06 PM Palliative Medicine Team RN Liaison [phone]

## 2011-08-22 NOTE — Progress Notes (Signed)
Pt's BP low, nitro gtt turned off  Maximino Greenland RN

## 2011-08-22 NOTE — Progress Notes (Signed)
Foley cath leaking and was dc'd intact. Pt tol without event. MD called and orders to replace foley cath.  Cath placed without difficulty using hosp. Protocol.  Pt without complaints

## 2011-08-22 NOTE — Consult Note (Signed)
Patient ZO:XWRUEA GITTY Lori      DOB: 1921/07/02      VWU:[id number]     Consult Note from the Palliative Medicine Team at [facility]    Consult Requested by: Dr Tenny Craw                PCP: Willow Ora, MD Reason for Consultation: Goals of care     Phone Number:[phone]  Assessment of patients Current state: 78 yr old female with CAD, critical 3 vessel disease, admitted with NSEMI,not a candidate for CABG or PCI per cardiology. Patient has ongoing intermittent chest pain, dyspnea.  Goals of Care: 1.  Code Status: DNR/DNI   2. Scope of Treatment: 1. Vital Signs: Q shift 2. Respiratory/Oxygen: Oxygen via nasal canula 3. Nutritional Support/Tube Feeds: No long term tube feeding 4. IVF: Continue 5. Review of Medications to be discontinued: Continue all the medications at this time 6. Labs: Continue 7. Telemetry: Yes   4. Disposition: Home with hospice   3. Symptom Management:   1. Anxiety/Agitation: None 2. Pain: Roxanol 2.5 mg po /sl q 4 hr prn 3. Bowel Regimen: Milk of magnesia prn, Last BM this morning  4. Psychosocial: Patient to go home with son  5. Spiritual: No spiritual needs at this time        Patient Documents Completed or Given: Document Given Completed  Advanced Directives Pkt    MOST    DNR    Gone from My Sight    Hard Choices      Brief HPI: 78 yr old female with h/o CAD, 3 vessel disease, admitted with NSTEMI, managed medically as per cardiology as she is not a candidate for CABG or PCI at this point. Patient still complains of intermittent chest pain, and shortness of breath. Goals of care done with patient and her family in the room. Discussed the code status, intubation, mechanical ventilation, artificial nutrition, disposition.  ROS: Pain- she has intermittent chest pain Insomnia- denies Nausea- denies Anxiety- denies Breathlessness- has intermittent shortness of breath Constipation- had BM this morning, has prn MOM which seems to be  working for her Depression- denies, though admits to intermittent bouts of sadness Eating- has been eating ok  Fatigue- denies     PMH:  Past Medical History  Diagnosis Date  . Hyperlipidemia   . Hypertension   . Hypothyroidism 6/11    Thyroid ablation, s/p treatment with iodine  . Hypercalcemia   . TIA (transient ischemic attack) 2000    ?TIA; carotid u/s 1-15% and brain MRI lacunar dz  . Chronic neck pain 2002  . CAD (coronary artery disease)     a. Cath 04/2011: LM 90, LAD Critical Ca stenosis ost, LCX 95 ost, RCA 70p, R->L collats,   med managed. b) NSTEM 6/13  . Ischemic cardiomyopathy     a. EF 35% 04/2011 LV Gram.  . Tachy-brady syndrome 2003    Per holter, s/p pacemaker implantation in [place] (pacemaker replaced 11/11 with Medtronic Device)  . Pneumonia March 2013  . DJD (degenerative joint disease) of cervical spine 2002    MRI Cspine  . ACE inhibitor intolerance   . Myocardial infarction   . Anginal pain   . Pacemaker   . Shortness of breath   . CHF (congestive heart failure)      PSH: Past Surgical History  Procedure Date  . Abdominal hysterectomy   . Tonsillectomy   . Cardiac catheterization 04/2011  . Insert / replace / remove pacemaker 2005  .  Cataracts    I have reviewed the FH and SH and  If appropriate update it with new information. Allergies  Allergen Reactions  . Simvastatin     unknown   Scheduled Meds:   . amLODipine  2.5 mg Oral Daily  . aspirin EC  81 mg Oral Daily  . cholecalciferol  1,000 Units Oral Daily  . clopidogrel  75 mg Oral Q breakfast  . furosemide  40 mg Intravenous BID  . furosemide  80 mg Intravenous Once  . heparin  3,000 Units Intravenous Once  . levothyroxine  100 mcg Oral Daily  . losartan  25 mg Oral Daily  . metoprolol tartrate  37.5 mg Oral BID  . ranolazine  500 mg Oral BID  . rosuvastatin  10 mg Oral Custom  . sodium chloride  3 mL Intravenous Q12H  . white petrolatum      . DISCONTD: enoxaparin (LOVENOX)  injection  40 mg Subcutaneous Q24H  . DISCONTD: rosuvastatin  5 mg Oral Custom   Continuous Infusions:   . heparin 650 Units/hr (08/22/11 1600)  . nitroGLYCERIN 5 mcg/min (08/21/11 1800)   PRN Meds:.sodium chloride, acetaminophen, ALPRAZolam, levalbuterol, magnesium hydroxide, morphine, nitroGLYCERIN, ondansetron (ZOFRAN) IV, sodium chloride, zolpidem    BP 116/51  Pulse 61  Temp 98.6 F (37 C) (Oral)  Resp 23  Ht 5\' 2"  (1.575 m)  Wt 47.5 kg (104 lb 11.5 oz)  BMI 19.15 kg/m2  SpO2 90%   PPS: 50%   Intake/Output Summary (Last 24 hours) at 08/22/11 1810 Last data filed at 08/22/11 1700  Gross per 24 hour  Intake 373.06 ml  Output   1276 ml  Net -902.94 ml   LBM: 08/22/11                       Stool Softner: Milk of magnesia  Physical Exam:  General: patient appears in in acute distress HEENT: Atraumatic, normocephalic Chest: Clear bilaterally CVS: S1S2 RRR Abdomen: Soft, nontender Ext: No edema Neuro: AO x 3, no focal deficit  Labs: CBC    Component Value Date/Time   WBC 9.3 08/22/2011 0325   RBC 3.25* 08/22/2011 0325   HGB 9.6* 08/22/2011 0325   HCT 28.0* 08/22/2011 0325   PLT 200 08/22/2011 0325   MCV 86.2 08/22/2011 0325   MCH 29.5 08/22/2011 0325   MCHC 34.3 08/22/2011 0325   RDW 16.3* 08/22/2011 0325   LYMPHSABS 0.7 08/21/2011 0826   MONOABS 0.8 08/21/2011 0826   EOSABS 0.0 08/21/2011 0826   BASOSABS 0.0 08/21/2011 0826    BMET    Component Value Date/Time   NA 139 08/22/2011 0325   K 3.9 08/22/2011 0325   CL 101 08/22/2011 0325   CO2 28 08/22/2011 0325   GLUCOSE 114* 08/22/2011 0325   BUN 24* 08/22/2011 0325   CREATININE 1.23* 08/22/2011 0325   CALCIUM 9.2 08/22/2011 0325   CALCIUM 10.0 11/18/2006 0000   GFRNONAA 38* 08/22/2011 0325   GFRAA 44* 08/22/2011 0325    CMP     Component Value Date/Time   NA 139 08/22/2011 0325   K 3.9 08/22/2011 0325   CL 101 08/22/2011 0325   CO2 28 08/22/2011 0325   GLUCOSE 114* 08/22/2011 0325   BUN 24* 08/22/2011 0325   CREATININE 1.23* 08/22/2011  0325   CALCIUM 9.2 08/22/2011 0325   CALCIUM 10.0 11/18/2006 0000   PROT 6.8 05/03/2011 1843   ALBUMIN 3.7 05/03/2011 1843   AST 18 08/09/2011 0955  ALT 11 08/09/2011 0955   ALKPHOS 76 05/03/2011 1843   BILITOT 0.5 05/03/2011 1843   GFRNONAA 38* 08/22/2011 0325   GFRAA 44* 08/22/2011 0325        Time In Time Out Total Time Spent with Patient Total Overall Time  5 pm 6:15 pm 60 min 75 min    Greater than 50%  of this time was spent counseling and coordinating care related to the above assessment and plan.

## 2011-08-23 ENCOUNTER — Inpatient Hospital Stay (HOSPITAL_COMMUNITY): Payer: Medicare Other

## 2011-08-23 DIAGNOSIS — I209 Angina pectoris, unspecified: Secondary | ICD-10-CM

## 2011-08-23 DIAGNOSIS — I5022 Chronic systolic (congestive) heart failure: Secondary | ICD-10-CM

## 2011-08-23 LAB — HEPARIN LEVEL (UNFRACTIONATED): Heparin Unfractionated: 0.25 IU/mL — ABNORMAL LOW (ref 0.30–0.70)

## 2011-08-23 LAB — BASIC METABOLIC PANEL
CO2: 28 mEq/L (ref 19–32)
Calcium: 8.7 mg/dL (ref 8.4–10.5)
Creatinine, Ser: 1.01 mg/dL (ref 0.50–1.10)
GFR calc Af Amer: 56 mL/min — ABNORMAL LOW (ref >90)
Sodium: 138 mEq/L (ref 135–145)

## 2011-08-23 LAB — CBC
Platelets: 205 10*3/uL (ref 150–400)
RBC: 3.12 MIL/uL — ABNORMAL LOW (ref 3.87–5.11)
RDW: 16.4 % — ABNORMAL HIGH (ref 11.5–15.5)
WBC: 8.8 10*3/uL (ref 4.0–10.5)

## 2011-08-23 LAB — PRO B NATRIURETIC PEPTIDE: Pro B Natriuretic peptide (BNP): 12895 pg/mL — ABNORMAL HIGH (ref 0–450)

## 2011-08-23 MED ORDER — POTASSIUM CHLORIDE CRYS ER 20 MEQ PO TBCR
40.0000 meq | EXTENDED_RELEASE_TABLET | Freq: Once | ORAL | Status: AC
  Administered 2011-08-23: 40 meq via ORAL
  Filled 2011-08-23: qty 2

## 2011-08-23 MED ORDER — HEPARIN SODIUM (PORCINE) 5000 UNIT/ML IJ SOLN
5000.0000 [IU] | Freq: Three times a day (TID) | INTRAMUSCULAR | Status: DC
  Administered 2011-08-23 – 2011-08-27 (×10): 5000 [IU] via SUBCUTANEOUS
  Filled 2011-08-23 (×15): qty 1

## 2011-08-23 MED ORDER — POTASSIUM CHLORIDE CRYS ER 20 MEQ PO TBCR
20.0000 meq | EXTENDED_RELEASE_TABLET | Freq: Every day | ORAL | Status: DC
  Administered 2011-08-24 – 2011-08-27 (×4): 20 meq via ORAL
  Filled 2011-08-23 (×5): qty 1

## 2011-08-23 NOTE — Care Management Note (Signed)
    Page 1 of 2   08/23/2011     5:06:45 PM   CARE MANAGEMENT NOTE 08/23/2011  Patient:  Lori Trujillo, Lori Trujillo   Account Number:  [id number]  Date Initiated:  08/22/2011  Documentation initiated by:  Donn Pierini  Subjective/Objective Assessment:   Pt admitted with SOB, CHF, c/p     Action/Plan:   PTA pt lived at home alone [facility] the [facility]-   Anticipated DC Date:  08/26/2011   Anticipated DC Plan:  HOME W HOME HEALTH SERVICES      DC Planning Services  CM consult      [facility] Choice  Resumption Of Svcs/PTA Provider   Choice offered to / List presented to:  C-1 Patient        HH arranged  HH-1 RN      [facility] agency  [facility]   Status of service:  In process, will continue to follow Medicare Important Message given?   (If response is "NO", the following Medicare IM given date fields will be blank) Date Medicare IM given:   Date Additional Medicare IM given:    Discharge Disposition:    Per UR Regulation:  Reviewed for med. necessity/level of care/duration of stay  If discussed at Long Length of Stay Meetings, dates discussed:    Comments:  PCP- PAZ, JOSE E  08/23/11 1645- Donn Pierini RN, BSN [phone] did not get chance to speak with son while he was here- f/u done with pt- she states they did talk about agencies and her son has list. Attempted to call son via TC- did not reach him- will have weekend CM f/u on families choice for hospice agency.  08/23/11- 1130- Donn Pierini RN, BSN [phone] Spoke with pt at bedside regarding d/c plans- pt states the plan is for her to go home with her son at discharge- asked pt about the [facility] meeting that was last night and pt states that they did discuss things and they are interested in Hospice at home- explained to her about Hospice choice and left list of Hospice agengies for her to review with her son and daughter. Pt states that her son is on his way- will try to speak with him when he arrives.  08/22/11- 1045-  Donn Pierini RN, BSN [phone] Spoke with pt at bedside- per conversation pt states that she lives alone at the apartment- has a cane, w/c, and walker- uses a cane mostly. She is active with [facility] for [facility] services- has a HH-RN that comes. Will need resumption orders at discharge for HH-RN.  Also had conversation with Cardiology MD regarding plan of care- depending on GOC with family pt may need hospice at home vs higher level of assistance. NCM to follow for d/c needs and planning.

## 2011-08-23 NOTE — Clinical Documentation Improvement (Signed)
POA DOCUMENTATION CLARIFICATION QUERY  THIS DOCUMENT IS NOT A PERMANENT PART OF THE MEDICAL RECORD  TO RESPOND TO THE THIS QUERY, FOLLOW THE INSTRUCTIONS BELOW:  1. If needed, update documentation for the patient's encounter via the notes activity.  2. Access this query again and click edit on the In [employer].  3. After updating, or not, click F2 to complete all highlighted (required) fields concerning your review. Select "additional documentation in the medical record" OR "no additional documentation provided".  4. Click Sign note button.  5. The deficiency will fall out of your In Basket *Please let us know if you are not able to complete this workflow by phone or e-mail (listed below).  Please update your documentation within the medical record to reflect your response to this query.                                                                                    08/23/11  Dear Alfredo Collymore [place] MD,FACC   In a better effort to capture your patient's severity of illness, reflect appropriate length of stay and utilization of resources, a review of the patient medical record has revealed the following indicators.    Based on your clinical judgment, please clarify and document in a progress note and/or discharge summary the clinical condition associated with the following supporting information:  In responding to this query please exercise your independent judgment.  The fact that a query is asked, does not imply that any particular answer is desired or expected.  Medicare rules require specification as to whether an inpatient diagnosis was present at the time of admission.   Noted in consult "She came to the ER where her CXR shows pulmonary edema"  Please clarify if the following diagnosis,             Acute Pulmonary edema , was:     _ Present at the time of admission  _ NOT present at the time of inpatient admission and it developed during the inpatient stay  _ Unable to  clinically determine whether the condition was present on admission.  _  Documentation insufficient to determine if condition was present at the time of inpatient admission  You may use possible, probable, or suspect with inpatient documentation. possible, probable, suspected diagnoses MUST be documented at the time of discharge  Reviewed: additional documentation in the medical record      Thank You,  Leonette Most Addison  Clinical Documentation Specialist RN, BSN:  Pager [phone]  HIM off # [phone]  Health Information Management [place]

## 2011-08-23 NOTE — Progress Notes (Signed)
Patient's automatic BP was 83/38;  Manual check was done by this nurse and was 100/42.  Will continue to monitor.    Vivi Martens RN

## 2011-08-23 NOTE — Progress Notes (Signed)
Subjective: No CP  Breathing OK Objective: Filed Vitals:   08/23/11 0200 08/23/11 0245 08/23/11 0347 08/23/11 0610  BP: 83/38 116/56  106/49  Pulse: 60 60  60  Temp:   97.2 F (36.2 C)   TempSrc:   Oral   Resp: 10 12  15   Height:      Weight:   102 lb 11.8 oz (46.6 kg)   SpO2: 100% 100%  100%   Weight change: 12 lb 11.8 oz (5.776 kg)  Intake/Output Summary (Last 24 hours) at 08/23/11 0810 Last data filed at 08/23/11 0600  Gross per 24 hour  Intake    265 ml  Output    776 ml  Net   -511 ml    General: Alert, awake, oriented x3, in no acute distress Neck:  JVP is increased Heart: Regular rate and rhythm, without murmurs, rubs, gallops.  Lungs:Rales at bases bilaterally. Exemities:  No edema.   Neuro: Grossly intact, nonfocal.   Lab Results: Results for orders placed during the hospital encounter of 08/21/11 (from the past 24 hour(s))  HEPARIN LEVEL (UNFRACTIONATED)     Status: Normal   Collection Time   08/22/11 11:29 AM      Component Value Range   Heparin Unfractionated 0.45  0.30 - 0.70 IU/mL  BASIC METABOLIC PANEL     Status: Abnormal   Collection Time   08/23/11  3:40 AM      Component Value Range   Sodium 138  135 - 145 mEq/L   Potassium 3.2 (*) 3.5 - 5.1 mEq/L   Chloride 100  96 - 112 mEq/L   CO2 28  19 - 32 mEq/L   Glucose, Bld 110 (*) 70 - 99 mg/dL   BUN 20  6 - 23 mg/dL   Creatinine, Ser 1.61  0.50 - 1.10 mg/dL   Calcium 8.7  8.4 - 09.6 mg/dL   GFR calc non Af Amer 48 (*) >90 mL/min   GFR calc Af Amer 56 (*) >90 mL/min  CBC     Status: Abnormal   Collection Time   08/23/11  3:40 AM      Component Value Range   WBC 8.8  4.0 - 10.5 K/uL   RBC 3.12 (*) 3.87 - 5.11 MIL/uL   Hemoglobin 9.3 (*) 12.0 - 15.0 g/dL   HCT 04.5 (*) 40.9 - 81.1 %   MCV 86.5  78.0 - 100.0 fL   MCH 29.8  26.0 - 34.0 pg   MCHC 34.4  30.0 - 36.0 g/dL   RDW 91.4 (*) 78.2 - 95.6 %   Platelets 205  150 - 400 K/uL  HEPARIN LEVEL (UNFRACTIONATED)     Status: Abnormal   Collection Time    08/23/11  3:40 AM      Component Value Range   Heparin Unfractionated 0.25 (*) 0.30 - 0.70 IU/mL    Studies/Results: Dg Chest Port 1 View  08/21/2011  *RADIOLOGY REPORT*  Clinical Data: Chest pain and shortness of breath.  PORTABLE CHEST - 1 VIEW  Comparison: 06/28/2011  Findings: Semi upright portable view of the chest was obtained. Patchy airspace densities, right side greater than left.  Heart size is stable.  Atherosclerotic calcifications involving the arch. The patient has a left cardiac pacemaker.  Mild fullness in the right hilum could be related to vascular congestion.  IMPRESSION: Patchy interstitial and airspace densities suggest asymmetric pulmonary edema.  Original Report Authenticated By: Richarda Overlie, M.D.    Medications: Meds reviewed.  Patient Active Hospital Problem List: Acute on chronic systolic CHF (congestive heart failure) (08/21/2011)   Assessment: Volume still appears increased.  I would continue IV lasix.  Follow response.  Has had net 1.6L negative.    HYPERLIPIDEMIA (07/28/2006)   Assessment: On Crestor 10  HYPERTENSION (07/28/2006)   Assessment: I have held norvasc as BP is marginal this AM  Follow.  Sinoatrial node dysfunction (07/02/2008)   Assessment: s/p PPM.  NSTEMI (non-ST elevated myocardial infarction) (05/04/2011)   Assessment: Reviewed cath yesterday.  Patient is not a candidate for PCI or surgery.  Critical dz of LM, LCx LAD  Plan for medical therapy.  Goal for comfort care.  Will diurese.  Stop heparin.    Anemia:  Hgb 11.1 on admit.  Now 9.3.  D/C heparin.  Guaiac stool.  Follow. Will continue ASA and plavix.    LOS: 2 days   Dietrich Pates 08/23/2011, 8:10 AM

## 2011-08-23 NOTE — Progress Notes (Signed)
ANTICOAGULATION CONSULT NOTE - Follow Up Consult  Pharmacy Consult for heparin Indication: chest pain/ACS  Labs:  Basename 08/23/11 0340 08/22/11 1129 08/22/11 0325 08/21/11 2214 08/21/11 1741 08/21/11 0901  HGB 9.3* -- 9.6* -- -- --  HCT 27.0* -- 28.0* -- 31.3* --  PLT 205 -- 200 -- 236 --  APTT -- -- -- -- -- --  LABPROT -- -- -- 18.0* -- --  INR -- -- -- 1.46 -- --  HEPARINUNFRC 0.25* 0.45 0.55 -- -- --  CREATININE -- -- 1.23* -- 1.20* 1.30*  CKTOTAL -- -- 615* 720* 818* --  CKMB -- -- 15.7* 23.1* 34.0* --  TROPONINI -- -- >20.00* >20.00* >20.00* --    Assessment: 78yo female now subtherapeutic on heparin after two levels at goal, no issues with infusion per RN.  Goal of Therapy:  Heparin level 0.3-0.7 units/ml   Plan:  Will increase heparin gtt by 1 unit/kg/hr to 700 units/hr and check level in 8hr.  Colleen Can PharmD BCPS 08/23/2011,4:55 AM

## 2011-08-24 LAB — CBC
Platelets: 221 10*3/uL (ref 150–400)
RBC: 3.11 MIL/uL — ABNORMAL LOW (ref 3.87–5.11)
RDW: 16.3 % — ABNORMAL HIGH (ref 11.5–15.5)
WBC: 6.6 10*3/uL (ref 4.0–10.5)

## 2011-08-24 MED ORDER — LOSARTAN POTASSIUM 25 MG PO TABS
25.0000 mg | ORAL_TABLET | Freq: Every day | ORAL | Status: DC
  Administered 2011-08-25 – 2011-08-27 (×3): 25 mg via ORAL
  Filled 2011-08-24 (×3): qty 1

## 2011-08-24 MED ORDER — METOPROLOL TARTRATE 25 MG PO TABS
25.0000 mg | ORAL_TABLET | Freq: Two times a day (BID) | ORAL | Status: DC
  Administered 2011-08-24 – 2011-08-27 (×5): 25 mg via ORAL
  Filled 2011-08-24 (×7): qty 1

## 2011-08-24 MED ORDER — MENTHOL 3 MG MT LOZG
1.0000 | LOZENGE | OROMUCOSAL | Status: DC | PRN
  Administered 2011-08-24: 3 mg via ORAL
  Filled 2011-08-24 (×2): qty 9

## 2011-08-24 NOTE — Progress Notes (Signed)
Subjective:  C/o dyspnea and some chest tightness earlier today.  Some meds on hold b/o BP.  Objective:  Vital Signs in the last 24 hours: BP 107/59  Pulse 60  Temp 98.4 F (36.9 C) (Oral)  Resp 17  Ht 5\' 2"  (1.575 m)  Wt 46.6 kg (102 lb 11.8 oz)  BMI 18.79 kg/m2  SpO2 100%  Physical Exam: Elderly BF in NAD Lungs:  Rales bases Cardiac:  Regular rhythm, normal S1 and S2, no S3 1-2/6 systolic murmur Abdomen:  Soft, nontender, no masses Extremities:  No edema present  Intake/Output from previous day: 08/02 0701 - 08/03 0700 In: 23 [I.V.:23] Out: 1550 [Urine:1550] Weight Filed Weights   08/21/11 1500 08/22/11 0456 08/23/11 0347  Weight: 47 kg (103 lb 9.9 oz) 47.5 kg (104 lb 11.5 oz) 46.6 kg (102 lb 11.8 oz)   Lab Results: Basic Metabolic Panel:  Basename 08/23/11 0340 08/22/11 0325  NA 138 139  K 3.2* 3.9  CL 100 101  CO2 28 28  GLUCOSE 110* 114*  BUN 20 24*  CREATININE 1.01 1.23*   CBC:  Basename 08/24/11 0432 08/23/11 0340  WBC 6.6 8.8  NEUTROABS -- --  HGB 9.2* 9.3*  HCT 27.0* 27.0*  MCV 86.8 86.5  PLT 221 205   BNP    Component Value Date/Time   PROBNP 12895.0* 08/23/2011 0817   Telemetry: Paced rhtyhm  Assessment/Plan:  1. NSTEMI in patient with inoperable CAD 2. Palliative care plans for home Hospice 3. Acute on Chronic systolic CHF  Rec:  Move to floor, comfort care.  Med adjustments made  W. Ashley Royalty  MD [facility] Card[facility]  08/24/2011, 11:57 AM

## 2011-08-24 NOTE — Progress Notes (Signed)
Subjective: Patient seen and examined, comfortable. Denies pain or shortness of breath at this time.  Filed Vitals:   08/24/11 0800  BP: 107/59  Pulse: 60  Temp: 98.4 F (36.9 C)  Resp: 17    Chest: Clear Bilaterally Heart : S1S2 RRR Abdomen: Soft, nontender Ext : No edema Neuro: Alert, oriented x 3  A/P CAD Chest pain Dyspnea  Continue morphine prn Cardiology has discontinued heparin drip. Plan is for total comfort care. Patient to go home with home hospice.    Meredeth Ide Palliative Medicine Team [phone]

## 2011-08-24 NOTE — Progress Notes (Signed)
Held patient's 22:00 dose of Metoprolol; BP was 90/63.  Patient's BP dropped into 80's systolic from 110's the night before when this nurse gave Metoprolol dose.  Will continue to monitor.   Vivi Martens RN

## 2011-08-25 MED ORDER — FUROSEMIDE 40 MG PO TABS
40.0000 mg | ORAL_TABLET | Freq: Two times a day (BID) | ORAL | Status: DC
  Administered 2011-08-25 – 2011-08-27 (×4): 40 mg via ORAL
  Filled 2011-08-25 (×6): qty 1

## 2011-08-25 NOTE — Progress Notes (Signed)
Subjective:  Feeling better today.  No chest pain.  Not SOB.  C/o lump in throat.  Objective:  Vital Signs in the last 24 hours: BP 104/66  Pulse 62  Temp 97.8 F (36.6 C) (Oral)  Resp 16  Ht 5\' 2"  (1.575 m)  Wt 46.6 kg (102 lb 11.8 oz)  BMI 18.79 kg/m2  SpO2 98%  Physical Exam: Elderly BF in NAD Lungs:  Rales bases Cardiac:  Regular rhythm, normal S1 and S2, no S3 1-2/6 systolic murmur Abdomen:  Soft, nontender, no masses Extremities:  No edema present  Intake/Output from previous day: 08/03 0701 - 08/04 0700 In: 720 [P.O.:720] Out: 1950 [Urine:1950] Weight Filed Weights   08/21/11 1500 08/22/11 0456 08/23/11 0347  Weight: 47 kg (103 lb 9.9 oz) 47.5 kg (104 lb 11.5 oz) 46.6 kg (102 lb 11.8 oz)   Lab Results: Basic Metabolic Panel:  Basename 08/23/11 0340  NA 138  K 3.2*  CL 100  CO2 28  GLUCOSE 110*  BUN 20  CREATININE 1.01   CBC:  Basename 08/24/11 0432 08/23/11 0340  WBC 6.6 8.8  NEUTROABS -- --  HGB 9.2* 9.3*  HCT 27.0* 27.0*  MCV 86.8 86.5  PLT 221 205   BNP    Component Value Date/Time   PROBNP 12895.0* 08/23/2011 0817   Telemetry: Paced rhtyhm  Assessment/Plan:  1. NSTEMI in patient with inoperable CAD 2. Palliative care plans for home Hospice 3. Acute on Chronic systolic CHF  Rec:  Move to floor, comfort care.  Change to po Lasix.  Check K.  Darden Palmer  MD [facility]  08/25/2011, 10:48 AM

## 2011-08-26 ENCOUNTER — Telehealth: Payer: Self-pay | Admitting: Internal Medicine

## 2011-08-26 DIAGNOSIS — I2589 Other forms of chronic ischemic heart disease: Secondary | ICD-10-CM

## 2011-08-26 LAB — BASIC METABOLIC PANEL
BUN: 14 mg/dL (ref 6–23)
Chloride: 97 mEq/L (ref 96–112)
Creatinine, Ser: 0.92 mg/dL (ref 0.50–1.10)
Glucose, Bld: 112 mg/dL — ABNORMAL HIGH (ref 70–99)
Potassium: 4.2 mEq/L (ref 3.5–5.1)

## 2011-08-26 MED ORDER — METOPROLOL TARTRATE 25 MG PO TABS: 25.0000 mg | ORAL_TABLET | Freq: Two times a day (BID) | ORAL | Status: AC

## 2011-08-26 MED ORDER — VITAMIN D 1000 UNITS PO TABS: 1000.0000 [IU] | ORAL_TABLET | Freq: Every day | ORAL | Status: AC

## 2011-08-26 MED ORDER — FUROSEMIDE 40 MG PO TABS: 40.0000 mg | ORAL_TABLET | Freq: Two times a day (BID) | ORAL | Status: DC

## 2011-08-26 MED ORDER — FUROSEMIDE 40 MG PO TABS: 40.0000 mg | ORAL_TABLET | Freq: Two times a day (BID) | ORAL | Status: AC

## 2011-08-26 MED ORDER — LEVOTHYROXINE SODIUM 100 MCG PO TABS: 100.0000 ug | ORAL_TABLET | Freq: Every day | ORAL | Status: DC

## 2011-08-26 MED ORDER — CLOPIDOGREL BISULFATE 75 MG PO TABS: 75.0000 mg | ORAL_TABLET | Freq: Every day | ORAL | Status: AC

## 2011-08-26 MED ORDER — ROSUVASTATIN CALCIUM 5 MG PO TABS: 5.0000 mg | ORAL_TABLET | ORAL | Status: AC

## 2011-08-26 MED ORDER — POTASSIUM CHLORIDE CRYS ER 20 MEQ PO TBCR: 20.0000 meq | EXTENDED_RELEASE_TABLET | Freq: Every day | ORAL | Status: AC

## 2011-08-26 MED ORDER — NITROGLYCERIN 0.4 MG SL SUBL: 0.4000 mg | SUBLINGUAL_TABLET | SUBLINGUAL | Status: AC | PRN

## 2011-08-26 MED ORDER — LOSARTAN POTASSIUM 25 MG PO TABS: 25.0000 mg | ORAL_TABLET | Freq: Every day | ORAL | Status: AC

## 2011-08-26 MED ORDER — METOPROLOL TARTRATE 25 MG PO TABS: 25.0000 mg | ORAL_TABLET | Freq: Two times a day (BID) | ORAL | Status: DC

## 2011-08-26 MED ORDER — LEVOTHYROXINE SODIUM 100 MCG PO TABS: 100.0000 ug | ORAL_TABLET | Freq: Every day | ORAL | Status: AC

## 2011-08-26 MED ORDER — VITAMIN D 1000 UNITS PO TABS: 1000.0000 [IU] | ORAL_TABLET | Freq: Every day | ORAL | Status: DC

## 2011-08-26 MED ORDER — NITROGLYCERIN 0.4 MG SL SUBL: 0.4000 mg | SUBLINGUAL_TABLET | SUBLINGUAL | Status: DC | PRN

## 2011-08-26 MED ORDER — RANOLAZINE ER 500 MG PO TB12: 500.0000 mg | ORAL_TABLET | Freq: Two times a day (BID) | ORAL | Status: AC

## 2011-08-26 NOTE — Progress Notes (Addendum)
Room 3040 - Lori Trujillo -   PMT-Palliative Medicine Team - RN Liaison Visit  Pt to discharge today - Pt & family chose [facility] for hospice services.  Discussed with son, dtr and patient:  Hospice services, DME requested.  Hospice Diagnosis: CHF - approved by Elliot Gurney MD, [facility] Medical Director.    Contact: Son Antonieta Pert [phone];  Dtr  Epifanio Lesches [phone]  DME ordered with Jill Alexanders [facility] [phone] - Copy of DME order given to family and CM RN Steward Drone.   Chalmers Cater, RN Palliative Medicine Team RN Liaison [phone]

## 2011-08-26 NOTE — Telephone Encounter (Signed)
I spoke with Lori Trujillo and made her aware that Dr. Graciela Husbands is out until next week. I will forward the message to him. I explained to Lori Trujillo that he will most likely do this, but I will get his ok. She is aware I will contact her as soon as possible.

## 2011-08-26 NOTE — Discharge Summary (Signed)
CARDIOLOGY DISCHARGE SUMMARY   Patient ID: Lori Trujillo MRN: [id number] DOB/AGE: [birth year] 78 y.o.  Admit date: 08/21/2011 Discharge date: 08/26/2011  Primary Discharge Diagnosis:    *Acute on chronic systolic CHF (congestive heart failure)/NSTEMI (non-ST elevated myocardial infarction)  Secondary Discharge Diagnosis:  Past Medical History  Diagnosis Date   Coronary artery disease-severe 3 vessel/left main      Sinoatrial node dysfunction /Pacemaker-Medtronic dual chamber    Ischemic cardiomyopathy   . Hyperlipidemia   . Hypertension   . Hypothyroidism 6/11    Thyroid ablation, s/p treatment with iodine  . Hypercalcemia   . TIA (transient ischemic attack) 2000    ?TIA; carotid u/s 1-15% and brain MRI lacunar dz  . Chronic neck pain 2002  . CAD (coronary artery disease)     a. Cath 04/2011: LM 90, LAD Critical Ca stenosis ost, LCX 95 ost, RCA 70p, R->L collats,   med managed. b) NSTEM 6/13  . Ischemic cardiomyopathy     a. EF 35% 04/2011 LV Gram.  . Tachy-brady syndrome 2003    Per holter, s/p pacemaker implantation in [place] (pacemaker replaced 11/11 with Medtronic Device)  . Pneumonia March 2013  . DJD (degenerative joint disease) of cervical spine 2002    MRI Cspine  . ACE inhibitor intolerance   . Myocardial infarction   . Anginal pain   .   COPD   . Shortness of breath   . CHF (congestive heart failure)     Consults: Hospice/palliative care  Procedures: Two-view chest x-ray  Hospital Course: Lori Trujillo is an 78 year old female with a history of coronary artery disease. She came in with a non-STEMI in April 2013. At that time cardiac cath showed severe native three-vessel disease, managed medically. She had increasing shortness of breath and multiple episodes of chest pain requiring nitroglycerin. She came to the emergency room where she was volume overloaded. She was admitted for further evaluation and treatment.  She had significant elevation in her cardiac enzymes  indicating a non-ST segment elevation MI. Her cath films were reviewed and no good percutaneous options were seen. Medical management was continued and she is on aspirin, Plavix, a beta blocker, and an ARB. Previously, she has not tolerated statins well but she takes a statin as often as she can tolerate it. She is not currently strong enough for cardiac rehabilitation.  She was volume overloaded by chest x-ray and by exam. She was diuresed with IV Lasix and her I/O's were negative by greater than 4 L during her hospital stay. As her volume status improved, her respiratory status improved, but she continued to get SOB with exertion, so home oxygen was ordered.   After her films and other data were reviewed, the situation was discussed with the patient and her children. Since no interventional treatment was available and medical therapy is her only option, a hospice consult was recommended and was called. She was seen by Dr. Sharl Ma and some medication changes were recommended as well as going home with hospice care. Equipment including a hospital bed was ordered. This was arranged. Care management was involved and arrangements were made. She was made a DO NOT RESUSCITATE.  Lori Trujillo's symptoms were controlled and arrangements had been made. Once all equipment is in place, she will be discharged to her son's home, with outpatient Hospice. She will follow up as an outpatient.  Labs:  Lab Results  Component Value Date   WBC 6.6 08/24/2011   HGB 9.2* 08/24/2011  HCT 27.0* 08/24/2011   MCV 86.8 08/24/2011   PLT 221 08/24/2011    Lab 08/26/11 0500  NA 134*  K 4.2  CL 97  CO2 32  BUN 14  CREATININE 0.92  CALCIUM 9.4  PROT --  BILITOT --  ALKPHOS --  ALT --  AST --  GLUCOSE 112*   Lab Results  Component Value Date   CKTOTAL 615* 08/22/2011   CKMB 15.7* 08/22/2011   TROPONINI >20.00* 08/22/2011   Pro B Natriuretic peptide (BNP)  Date/Time Value Range Status  08/23/2011  8:17 AM 12895.0* 0 - 450 pg/mL  Final  08/21/2011  8:20 AM 11291.0* 0 - 450 pg/mL Final   Lab Results  Component Value Date   TSH 2.997 08/21/2011      Radiology: Dg Chest 2 View 08/23/2011  *RADIOLOGY REPORT*  Clinical Data: Pneumonia.  Chest pain.  Congestion.  Coughing. Shortness of breath.  Hypertension.  CHEST - 2 VIEW  Comparison: 07/19/2008.  04/03/2011.  08/21/2011.  Findings: Dual lead transvenous pacemaker is in place with controller device on the left unchanged.  There is stable moderate cardiac silhouette enlargement.  There is mild vascular congestion pattern with decrease since previous study.  Patchy basilar infiltrative densities and atelectasis are seen posteriorly and inferiorly.  Bilateral pleural effusions are now seen.  There are small.  Right pleural effusion is larger than left. Ectasia and nonaneurysmal calcification of the thoracic aorta are seen.  There is osteopenic appearance of the bones.  Changes of degenerative disc disease and degenerative spondylosis are present.  Slight decreased height of a mid thoracic vertebral bodies is stable.  IMPRESSION: Pacemaker in place.  Stable cardiac silhouette enlargement. Decrease in vascular congestion pattern.  Patchy basilar infiltrative densities associated with atelectasis predominately posteriorly inferiorly.  Small bilateral pleural effusions have developed.  Right pleural effusion is larger than left.  Stable chronic findings and abnormalities are detailed above.  Original Report Authenticated By: Crawford Givens, M.D.   Dg Chest Port 1 View 08/21/2011  *RADIOLOGY REPORT*  Clinical Data: Chest pain and shortness of breath.  PORTABLE CHEST - 1 VIEW  Comparison: 06/28/2011  Findings: Semi upright portable view of the chest was obtained. Patchy airspace densities, right side greater than left.  Heart size is stable.  Atherosclerotic calcifications involving the arch. The patient has a left cardiac pacemaker.  Mild fullness in the right hilum could be related to vascular  congestion.  IMPRESSION: Patchy interstitial and airspace densities suggest asymmetric pulmonary edema.  Original Report Authenticated By: Richarda Overlie, M.D.   EKG: 21-Aug-2011 19:20:17 Atrial-paced rhythm Left ventricular hypertrophy with repolarization abnormality Abnormal ECG Since previous tracing 08/21/11 07:46 pacemaker is new, but it was seen on 06/30/11 Vent. rate 62 BPM PR interval 190 ms QRS duration 90 ms QT/QTc 438/444 ms P-R-T axes 23 -24 167  FOLLOW UP PLANS AND APPOINTMENTS Allergies  Allergen Reactions  . Simvastatin     unknown   Medication List  As of 08/26/2011 11:47 AM   STOP taking these medications         amLODipine 2.5 MG tablet         TAKE these medications         aspirin EC 81 MG tablet   Take 1 tablet (81 mg total) by mouth daily.      cholecalciferol 1000 UNITS tablet   Commonly known as: VITAMIN D   Take 1,000 Units by mouth daily.      clopidogrel 75 MG tablet  Commonly known as: PLAVIX   Take 1 tablet (75 mg total) by mouth daily with breakfast.      furosemide 40 MG tablet   Commonly known as: LASIX   Take 1 tablet (40 mg total) by mouth 2 (two) times daily.      levothyroxine 100 MCG tablet   Commonly known as: SYNTHROID, LEVOTHROID   Take 100 mcg by mouth daily.      losartan 25 MG tablet   Commonly known as: COZAAR   Take 1 tablet (25 mg total) by mouth daily.      metoprolol tartrate 25 MG tablet   Commonly known as: LOPRESSOR   Take 1 tablet (25 mg total) by mouth 2 (two) times daily.      nitroGLYCERIN 0.4 MG SL tablet   Commonly known as: NITROSTAT   Place 1 tablet (0.4 mg total) under the tongue every 5 (five) minutes as needed. For chest pain      potassium chloride SA 20 MEQ tablet   Commonly known as: K-DUR,KLOR-CON   Take 1 tablet (20 mEq total) by mouth daily.      ranolazine 500 MG 12 hr tablet   Commonly known as: RANEXA   Take 1 tablet (500 mg total) by mouth 2 (two) times daily.      rosuvastatin 5 MG  tablet   Commonly known as: CRESTOR   Take 1 tablet (5 mg total) by mouth 2 (two) times a week. Take on Tuesdays and Saturdays.           Discharge Orders    Future Appointments: Provider: Department: Dept Phone: Center:   09/03/2011 11:15 AM Duke Salvia, MD [facility] [phone] [facility]   11/14/2011 10:00 AM Wanda Plump, MD [facility] [phone] [facility]     Future Orders Please Complete By Expires   Diet - low sodium heart healthy      Increase activity slowly         BRING ALL MEDICATIONS WITH YOU TO FOLLOW UP APPOINTMENTS  Time spent with patient to include physician time: 41 min Signed: Theodore Demark 08/26/2011, 11:47 AM Co-Sign MD  I have seen, examined the patient, and reviewed the above assessment and plan.  Changes to above are made where necessary.    Co Sign: Hillis Range, MD 08/26/2011 8:03 PM

## 2011-08-26 NOTE — Progress Notes (Addendum)
Subjective:  Doing well today.  Denies CP or SOB.  She feels ready to go home.  Objective:  Vital Signs in the last 24 hours: BP 96/57  Pulse 63  Temp 98.4 F (36.9 C) (Oral)  Resp 18  Ht 5\' 2"  (1.575 m)  Wt 102 lb 11.8 oz (46.6 kg)  BMI 18.79 kg/m2  SpO2 100%  Physical Exam: Elderly appearing, NAD OP clear Lungs:   Few rales at bases Cardiac:  Regular rhythm, ,  1-2/6 systolic murmur Abdomen:  Soft, nontender, no masses Extremities:  No edema present  Intake/Output from previous day: 08/04 0701 - 08/05 0700 In: 840 [P.O.:840] Out: 1175 [Urine:1175] Weight Filed Weights   08/21/11 1500 08/22/11 0456 08/23/11 0347  Weight: 103 lb 9.9 oz (47 kg) 104 lb 11.5 oz (47.5 kg) 102 lb 11.8 oz (46.6 kg)   Lab Results: Basic Metabolic Panel:  Basename 08/26/11 0500  NA 134*  K 4.2  CL 97  CO2 32  GLUCOSE 112*  BUN 14  CREATININE 0.92   CBC:  Basename 08/24/11 0432  WBC 6.6  NEUTROABS --  HGB 9.2*  HCT 27.0*  MCV 86.8  PLT 221   BNP    Component Value Date/Time   PROBNP 12895.0* 08/23/2011 0817    Assessment/Plan:  1. NSTEMI in patient with inoperable CAD Continue ASA and plavix, metoprolol ranexa added for chest pain  2. Palliative care plans for home Hospice Will ask SW to arrange  3. Acute on Chronic systolic CHF- improved Lasix 40mg  BID at discharge  OK to discharge today if home hospice can be arranged. Follow-up with Dr Graciela Husbands in the office as needed  Fayrene Fearing Rawan Riendeau,MD

## 2011-08-26 NOTE — Progress Notes (Signed)
Arrangements made with Hospice, Care Manager and family. Needed equipment is ordered and will be delivered, but not till much later today. Pt has not been out of bed since admission except to bedside commode. Pt is to ambulate today and wait for hospital bed/oxygen and other supplies to be delivered. Discharge is deferred till am.

## 2011-08-26 NOTE — H&P (Signed)
Note already cosigned 08/21/11 at 11:29 am

## 2011-08-26 NOTE — Telephone Encounter (Signed)
New problem:  Per Bronson Ing from hospice & pallative care will Dr. Graciela Husbands agree to be patient attending for hospice.  Fax [phone]

## 2011-08-27 NOTE — Telephone Encounter (Signed)
It would be my pleasure

## 2011-08-29 NOTE — Telephone Encounter (Signed)
I spoke with the [facility] and made her aware that Dr. Graciela Husbands will be the attending for Lori Trujillo for Hospice.

## 2011-09-03 ENCOUNTER — Ambulatory Visit (INDEPENDENT_AMBULATORY_CARE_PROVIDER_SITE_OTHER): Payer: Medicare Other | Admitting: Internal Medicine

## 2011-09-03 ENCOUNTER — Encounter: Payer: Self-pay | Admitting: Internal Medicine

## 2011-09-03 VITALS — BP 104/58 | HR 61 | Ht 61.0 in | Wt 97.1 lb

## 2011-09-03 DIAGNOSIS — I2589 Other forms of chronic ischemic heart disease: Secondary | ICD-10-CM

## 2011-09-03 DIAGNOSIS — I255 Ischemic cardiomyopathy: Secondary | ICD-10-CM

## 2011-09-03 DIAGNOSIS — Z950 Presence of cardiac pacemaker: Secondary | ICD-10-CM

## 2011-09-03 DIAGNOSIS — I209 Angina pectoris, unspecified: Secondary | ICD-10-CM

## 2011-09-03 DIAGNOSIS — I5022 Chronic systolic (congestive) heart failure: Secondary | ICD-10-CM

## 2011-09-03 DIAGNOSIS — I208 Other forms of angina pectoris: Secondary | ICD-10-CM

## 2011-09-03 NOTE — Progress Notes (Signed)
HPI  Lori Trujillo is a 78 y.o. female Following her recent hospitalization at which time she underwent catheterization demonstrating three-vessel disease.:: Critical distal left main disease extending into the LAD and left circumflex with severe calcification  2. Total occlusion of the LAD with right to left collaterals  3. Moderate diffuse right coronary artery stenosis  4. Severe segmental left ventricular systolic dysfunction  This followed a markedly abnormal Myoview undertaken for chest pain.  She had a concurrent non-STEMI.  It was elected to treat her medically.  She has done relatively well with only occasional exertional chest tightness.  We uptitrated meds in May and begin  Ranexa in June   She is  Much improved with only reare chest pain often after meals She is now with hospice   Past Medical History  Diagnosis Date  . Hyperlipidemia   . Hypertension   . Hypothyroidism 6/11    Thyroid ablation, s/p treatment with iodine  . Hypercalcemia   . TIA (transient ischemic attack) 2000    ?TIA; carotid u/s 1-15% and brain MRI lacunar dz  . Chronic neck pain 2002  . CAD (coronary artery disease)     a. Cath 04/2011: LM 90, LAD Critical Ca stenosis ost, LCX 95 ost, RCA 70p, R->L collats,   med managed. b) NSTEM 6/13  . Ischemic cardiomyopathy     a. EF 35% 04/2011 LV Gram.  . Tachy-brady syndrome 2003    Per holter, s/p pacemaker implantation in [place] (pacemaker replaced 11/11 with Medtronic Device)  . Pneumonia March 2013  . DJD (degenerative joint disease) of cervical spine 2002    MRI Cspine  . ACE inhibitor intolerance   . Myocardial infarction   . Anginal pain   . Pacemaker   . Shortness of breath   . CHF (congestive heart failure)     Past Surgical History  Procedure Date  . Abdominal hysterectomy   . Tonsillectomy   . Cardiac catheterization 04/2011  . Insert / replace / remove pacemaker 2005  . Cataracts     Current Outpatient Prescriptions  Medication Sig  Dispense Refill  . aspirin EC 81 MG tablet Take 1 tablet (81 mg total) by mouth daily.  30 tablet    . cholecalciferol (VITAMIN D) 1000 UNITS tablet Take 1 tablet (1,000 Units total) by mouth daily.  30 tablet  11  . clopidogrel (PLAVIX) 75 MG tablet Take 1 tablet (75 mg total) by mouth daily with breakfast.  30 tablet  11  . furosemide (LASIX) 40 MG tablet Take 1 tablet (40 mg total) by mouth 2 (two) times daily.  60 tablet  11  . levothyroxine (SYNTHROID, LEVOTHROID) 100 MCG tablet Take 1 tablet (100 mcg total) by mouth daily.  30 tablet  11  . losartan (COZAAR) 25 MG tablet Take 1 tablet (25 mg total) by mouth daily.  30 tablet  11  . metoprolol tartrate (LOPRESSOR) 25 MG tablet Take 1 tablet (25 mg total) by mouth 2 (two) times daily.  60 tablet  11  . nitroGLYCERIN (NITROSTAT) 0.4 MG SL tablet Place 1 tablet (0.4 mg total) under the tongue every 5 (five) minutes as needed. For chest pain  25 tablet  3  . potassium chloride SA (K-DUR,KLOR-CON) 20 MEQ tablet Take 1 tablet (20 mEq total) by mouth daily.  30 tablet  11  . ranolazine (RANEXA) 500 MG 12 hr tablet Take 1 tablet (500 mg total) by mouth 2 (two) times daily.  60 tablet  11  . rosuvastatin (CRESTOR) 5 MG tablet Take 1 tablet (5 mg total) by mouth 2 (two) times a week. Take on Tuesdays and Saturdays.  30 tablet  11  . DISCONTD: benazepril (LOTENSIN) 40 MG tablet Take 1 tablet (40 mg total) by mouth daily.  30 tablet  6    Allergies  Allergen Reactions  . Simvastatin     unknown    Review of Systems negative except from HPI and PMH  Physical Exam BP 104/58  Pulse 61  Ht 5\' 1"  (1.549 m)  Wt 97 lb 1.9 oz (44.053 kg)  BMI 18.35 kg/m2 Well developed and well nourished in no acute distress weairng O2 HENT normal E scleral and icterus clear Neck Supple JVP flat; carotids brisk and full Clear to ausculation regular rate and rhythm, no murmurs gallops or rub Soft with active bowel sounds No clubbing cyanosis none Edema Alert  and oriented, grossly normal motor and sensory function Skin Warm and Dry  ECG: Sinus Rhythm/ with atrial pacing @ 61            Intervals  12/30/41  Axis -21     Assessment and  Plan

## 2011-09-03 NOTE — Assessment & Plan Note (Signed)
Stable much improved on ranexa will continue  QT interval ok

## 2011-09-03 NOTE — Patient Instructions (Addendum)
Your physician recommends that you schedule a follow-up appointment in: 10-12 weeks with Dr. Graciela Husbands.  Your physician recommends that you continue on your current medications as directed. Please refer to the Current Medication list given to you today.

## 2011-09-03 NOTE — Assessment & Plan Note (Signed)
stabvel euvolemic continue current meds

## 2011-09-03 NOTE — Assessment & Plan Note (Signed)
As above.

## 2011-09-03 NOTE — Assessment & Plan Note (Signed)
The patient's device was interrogated.  The information was reviewed. No changes were made in the programming.    

## 2011-09-10 ENCOUNTER — Telehealth: Payer: Self-pay | Admitting: Internal Medicine

## 2011-09-10 NOTE — Telephone Encounter (Signed)
FYI; PT PASSED AWAY THIS AM @ 1130A

## 2011-09-10 NOTE — Telephone Encounter (Signed)
Will forward to Dr. Graciela Husbands as an [place]ain Childes. I have notified the device clinic as well.

## 2011-09-13 LAB — PACEMAKER DEVICE OBSERVATION
AL IMPEDENCE PM: 404 Ohm
BATTERY VOLTAGE: 2.78 V
VENTRICULAR PACING PM: 0

## 2011-09-22 DEATH — deceased

## 2011-11-13 ENCOUNTER — Ambulatory Visit: Payer: Medicare Other | Admitting: Internal Medicine

## 2011-11-14 ENCOUNTER — Ambulatory Visit: Payer: Medicare Other | Admitting: Internal Medicine

## 2011-11-15 ENCOUNTER — Ambulatory Visit: Payer: Medicare Other | Admitting: Internal Medicine

## 2011-11-15 IMAGING — CT CT HEAD W/O CM
1 series · 15 of 30 positions shown, 19 images · non-contrast
Comparison: None.

CLINICAL DATA: Dizziness.  Unsteady gait.  Slurred speech.
Pacemaker.

CT HEAD WITHOUT CONTRAST
TECHNIQUE: Contiguous axial images were obtained from the base of
the skull through the vertex without contrast.

[Series 3: head_seq -c 4.5 h37s st · axial · 0.40mm/px · z∈[+1202,+1328]mm · 15 of 32 slices shown, 19 images]
[im 2/32  brain]
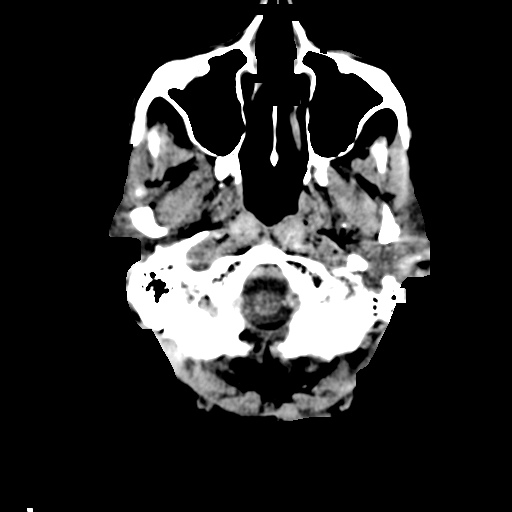
[im 2/32  bone]
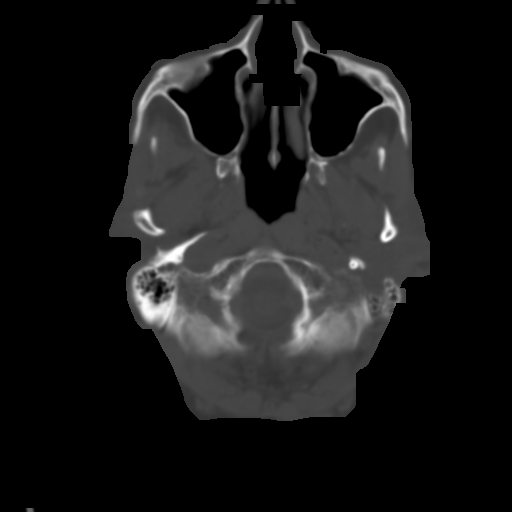
[im 4/32  brain]
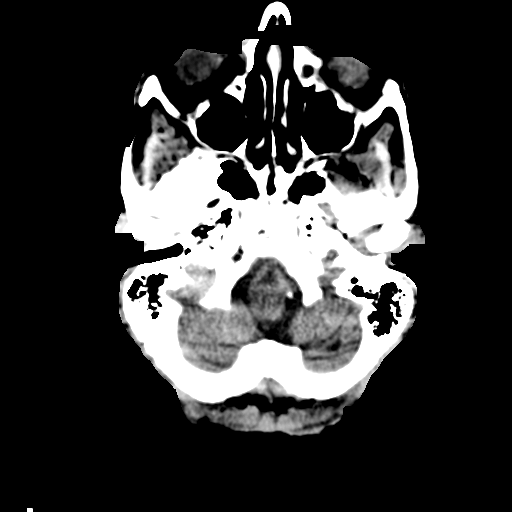
[im 6/32  brain]
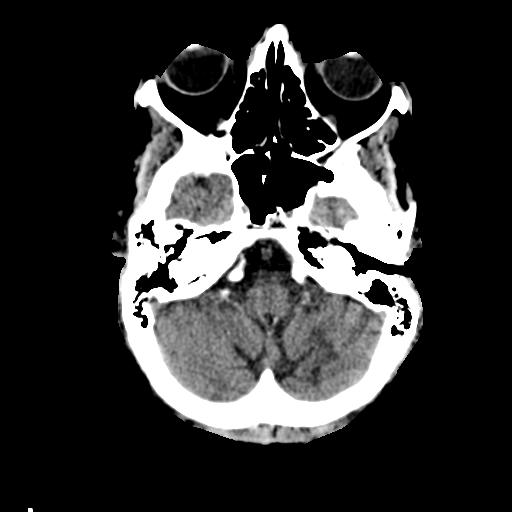
[im 8/32  brain]
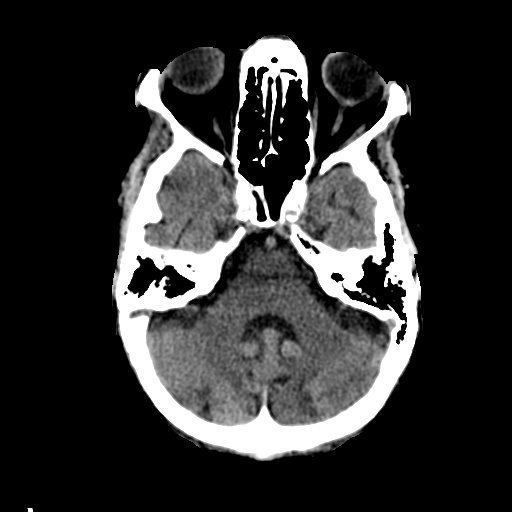
[im 10/32  brain]
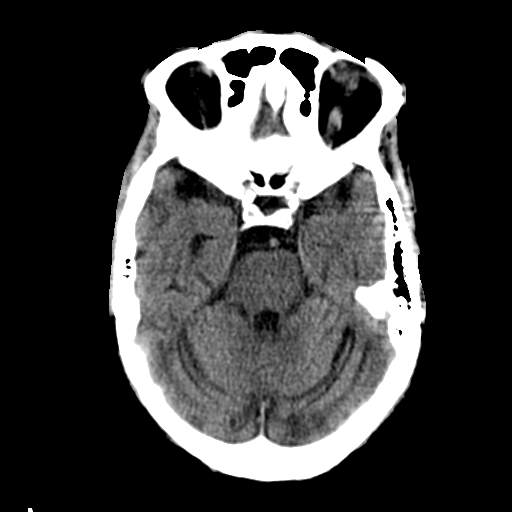
[im 10/32  bone]
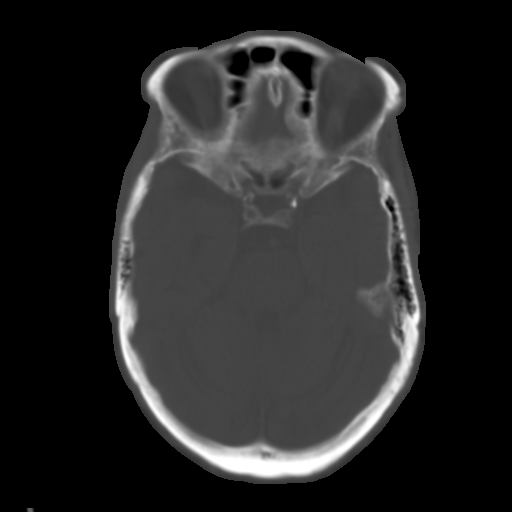
[im 12/32  brain]
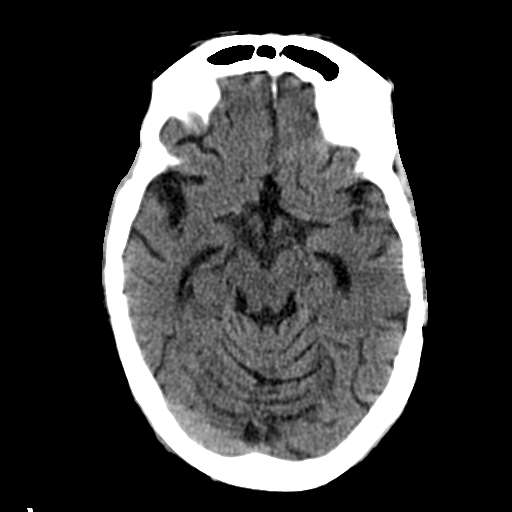
[im 14/32  brain]
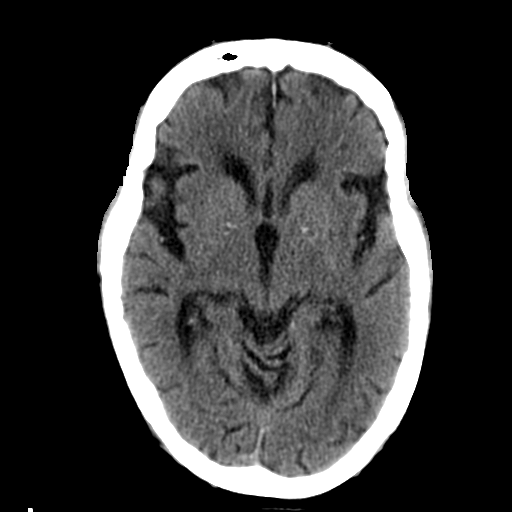
[im 17/32  brain]
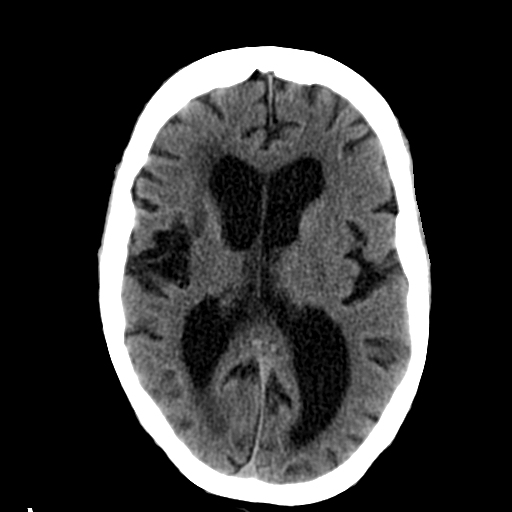
[im 18/32  brain]
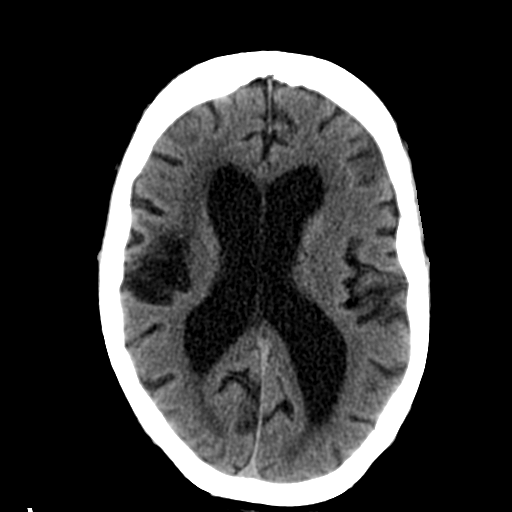
[im 18/32  bone]
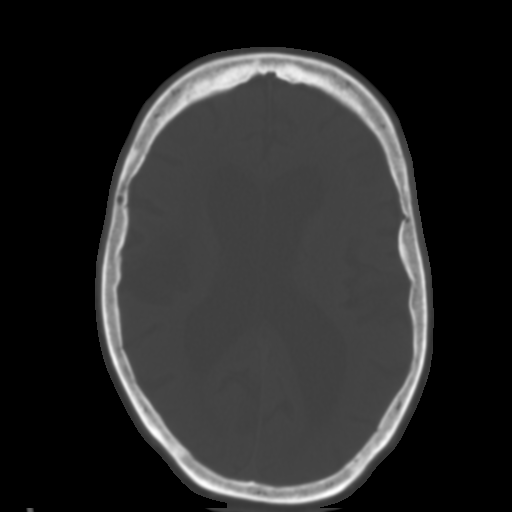
[im 20/32  brain]
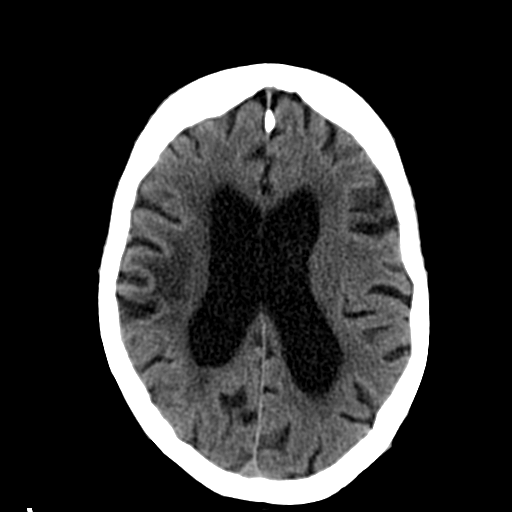
[im 22/32  brain]
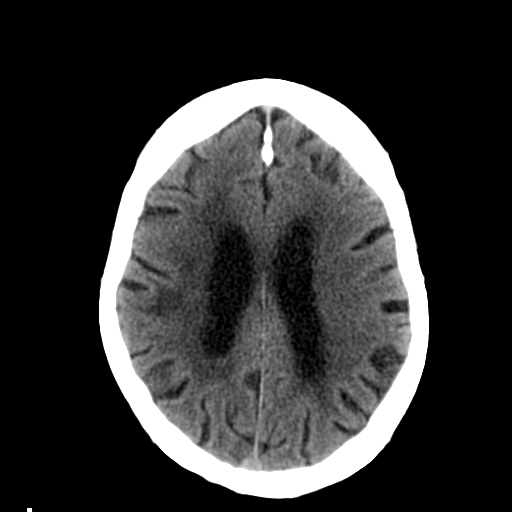
[im 24/32  brain]
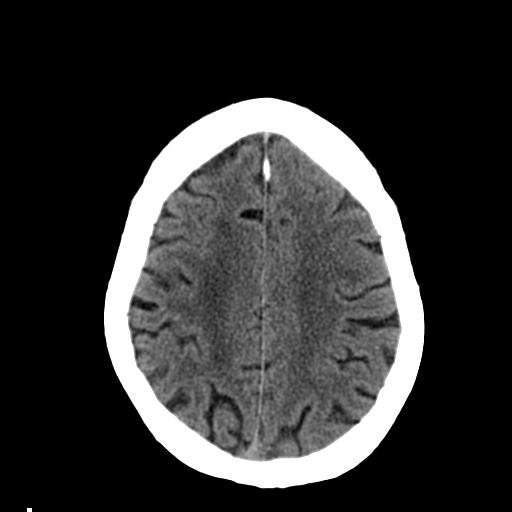
[im 26/32  brain]
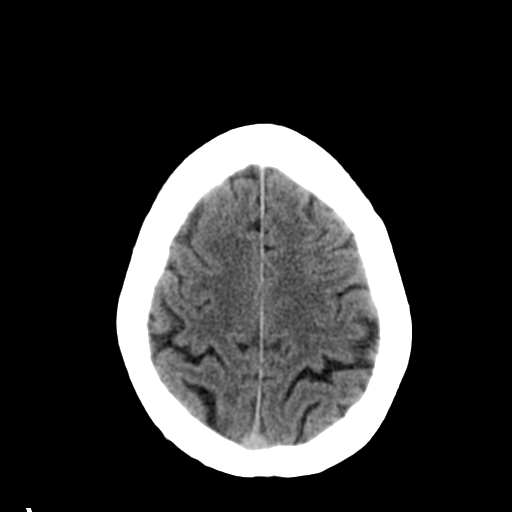
[im 26/32  bone]
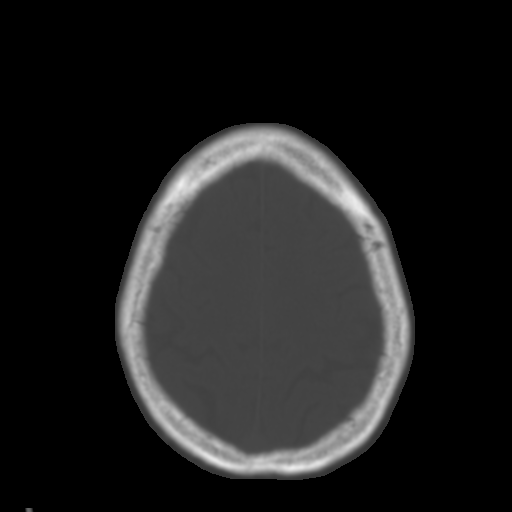
[im 28/32  brain]
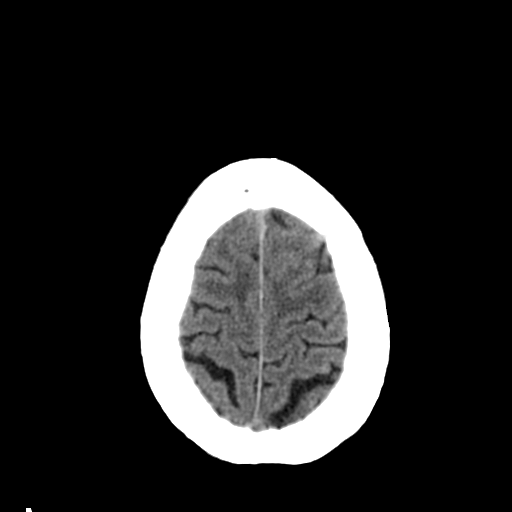
[im 30/32  brain]
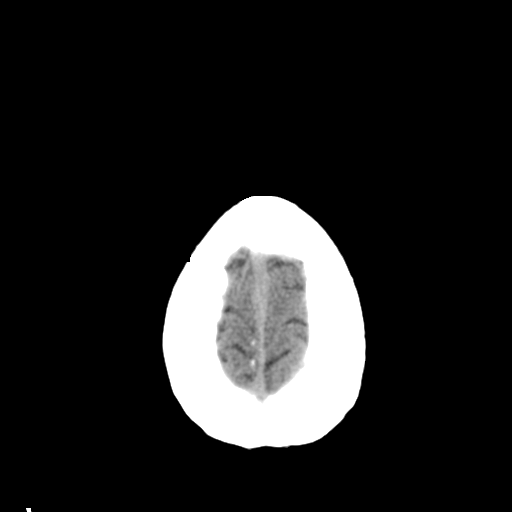

[15 of 30 positions shown; findings below may reference images not displayed]

FINDINGS: No acute intracranial findings.  No definite acute
infarction.  No hemorrhage or mass lesion.  Focal hypoattenuation
posterior aspect of the left cerebellar hemisphere is compatible
with an infarction likely remote in nature.  Right parietal lobe
hypoattenuation and ectatic sulci are compatible with remote right
MCA distribution infarction.  There is a remote right external
capsule lacunar infarction.

There are findings compatible with generalized chronic small vessel
disease of the cerebral white matter.  Generalized atrophic changes
do not appear marked for the patient's age.
IMPRESSION: Findings compatible with remote infarctions of the right parietal
lobe, right external capsule, and left cerebellum.  Chronic small
vessel disease of the cerebral deep white matter.  No definite
acute intracranial abnormality.

## 2013-08-05 NOTE — Telephone Encounter (Signed)
Close encounter 

## 2013-11-27 IMAGING — CR DG CHEST 1V PORT
1 series · 1 of 1 positions shown · non-contrast
Comparison: 04/03/2011

CLINICAL DATA: Respiratory distress

PORTABLE CHEST - 1 VIEW

[AP]
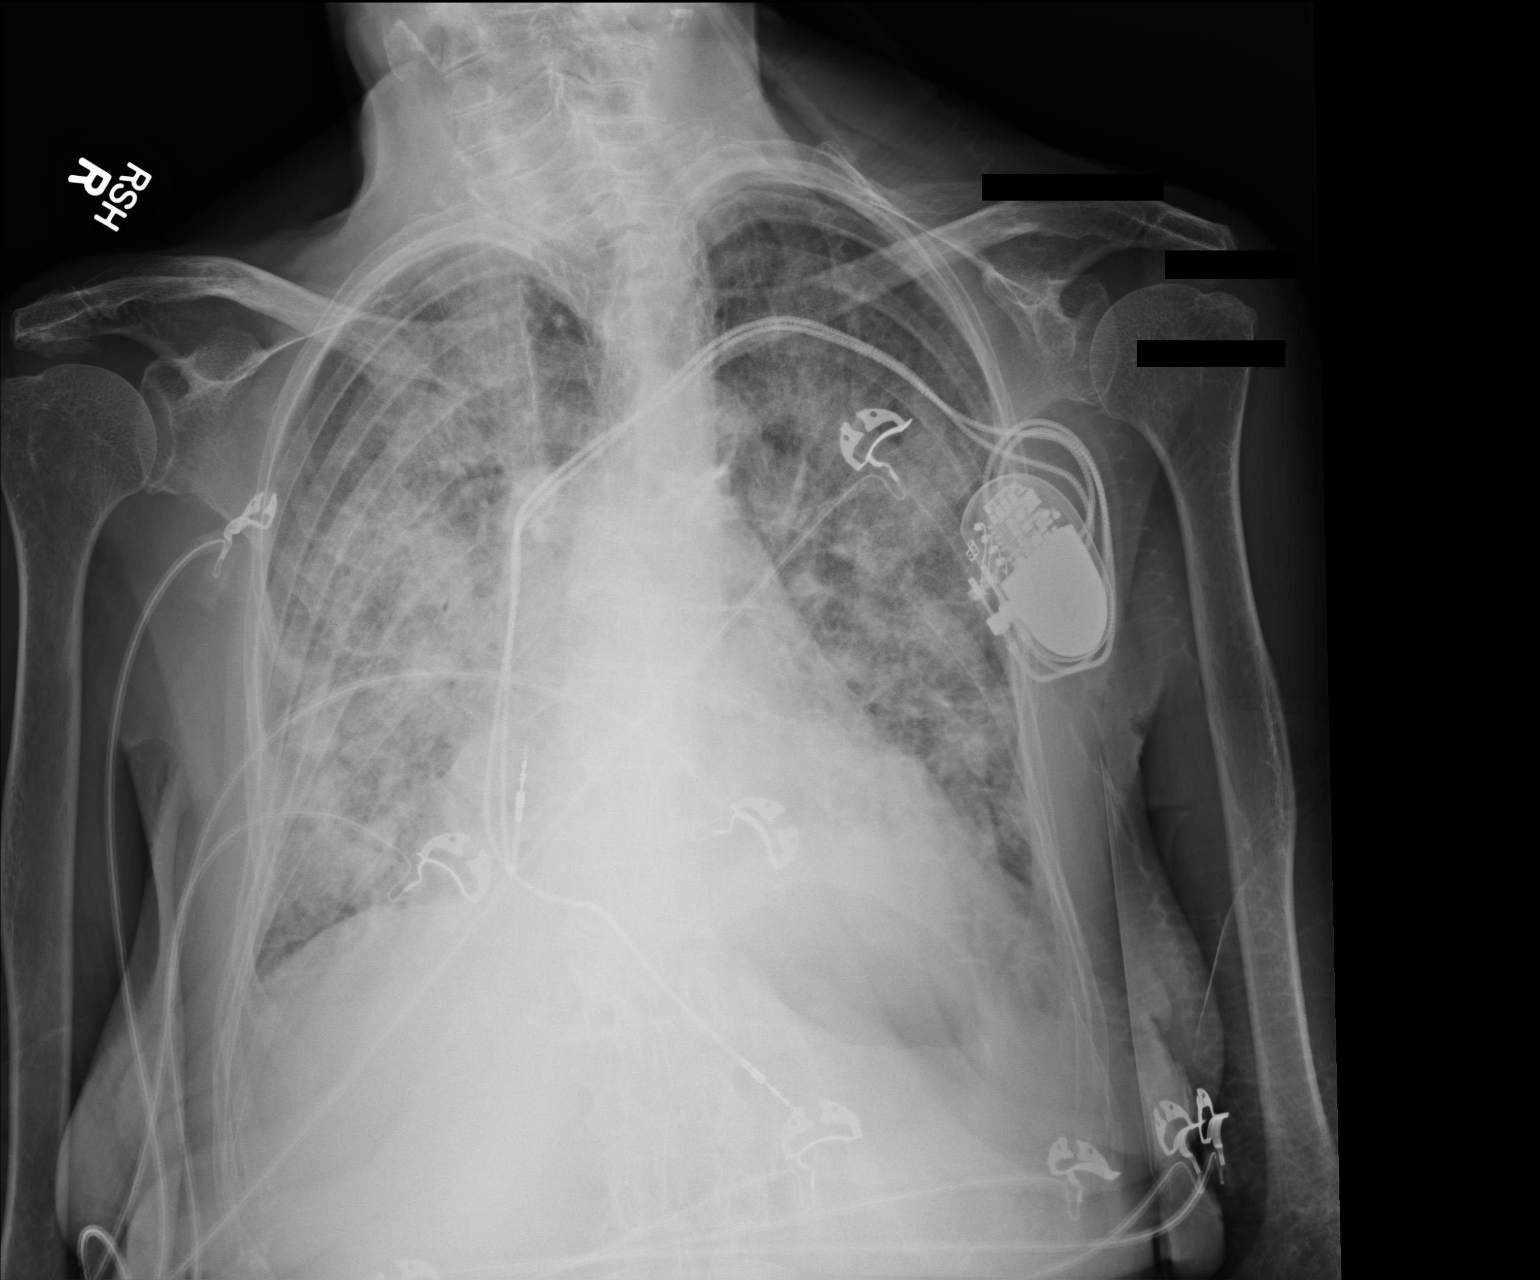

[1 of 1 positions shown; findings below may reference images not displayed]

FINDINGS: Diffuse bilateral airspace disease in a pulmonary edema
pattern associated with cardiomegaly.  Dual lead left subclavian
pacemaker device and leads stable.  No pneumothorax.
IMPRESSION: CHF.

## 2013-12-30 ENCOUNTER — Encounter (HOSPITAL_COMMUNITY): Payer: Self-pay | Admitting: Cardiovascular Disease
